# Patient Record
Sex: Female | Born: 2005 | Race: Black or African American | Hispanic: No | Marital: Single | Smoking: Never smoker
Health system: Southern US, Community
[De-identification: ages and names within clinical notes are randomized; demographics above are authoritative.]

## PROBLEM LIST (undated history)

## (undated) DIAGNOSIS — E669 Obesity, unspecified: Secondary | ICD-10-CM

## (undated) HISTORY — DX: Obesity, unspecified: E66.9

---
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
## 2024-01-04 ENCOUNTER — Other Ambulatory Visit: Payer: Self-pay | Admitting: Medical Genetics

## 2024-01-04 DIAGNOSIS — F411 Generalized anxiety disorder: Secondary | ICD-10-CM | POA: Diagnosis not present

## 2024-01-05 NOTE — Telephone Encounter (Signed)
Patient called back and said that she talked to her insurance and they said it can be covered but it just needs a prior auth done first. Patient said she talked to the pharmacy as well and they said they had faxed over form for the prior auth. Please advise

## 2024-01-06 ENCOUNTER — Telehealth: Payer: Self-pay

## 2024-01-06 ENCOUNTER — Other Ambulatory Visit (HOSPITAL_COMMUNITY): Payer: Self-pay

## 2024-01-06 NOTE — Telephone Encounter (Signed)
Trulicity needs PA

## 2024-01-06 NOTE — Telephone Encounter (Signed)
Pharmacy Patient Advocate Encounter   Received notification from Pt Calls Messages that prior authorization for Trulicity is required/requested.   Insurance verification completed.   The patient is insured through [facility] .   Per test claim: Must Try and fail metformin.   I see she has just started metformin so there is not enough data to say she has failed it.   Also, Trulicity is only indicated for type 2 diabetes, which I see she does not have, nor does she has an A1c at or above 6.5.    I am unable to submit a PA for Trulicity at this time.

## 2024-01-10 DIAGNOSIS — F603 Borderline personality disorder: Secondary | ICD-10-CM | POA: Diagnosis not present

## 2024-01-10 DIAGNOSIS — F4312 Post-traumatic stress disorder, chronic: Secondary | ICD-10-CM | POA: Diagnosis not present

## 2024-01-10 DIAGNOSIS — F3181 Bipolar II disorder: Secondary | ICD-10-CM | POA: Diagnosis not present

## 2024-01-10 DIAGNOSIS — F413 Other mixed anxiety disorders: Secondary | ICD-10-CM | POA: Diagnosis not present

## 2024-01-11 DIAGNOSIS — F411 Generalized anxiety disorder: Secondary | ICD-10-CM | POA: Diagnosis not present

## 2024-01-16 ENCOUNTER — Encounter: Payer: Self-pay | Admitting: Pediatrics

## 2024-01-16 ENCOUNTER — Ambulatory Visit (INDEPENDENT_AMBULATORY_CARE_PROVIDER_SITE_OTHER): Payer: Medicaid Other | Admitting: Pediatrics

## 2024-01-16 VITALS — Wt 228.0 lb

## 2024-01-16 DIAGNOSIS — R234 Changes in skin texture: Secondary | ICD-10-CM | POA: Diagnosis not present

## 2024-01-16 DIAGNOSIS — R238 Other skin changes: Secondary | ICD-10-CM | POA: Diagnosis not present

## 2024-01-16 NOTE — Patient Instructions (Signed)
Referred to [facility]  Wash hair with Selsun Blue shampoo 2 times a week Follow up as needed  At [facility] we value your feedback. You may receive a survey about your visit today. Please share your experience as we strive to create trusting relationships with our patients to provide genuine, compassionate, quality care.

## 2024-01-16 NOTE — Progress Notes (Signed)
Subjective:     Jenna Santos is a 18 y.o. female who presents for evaluation of scalp irritation. Starting approximately 3 months ago, she has started having periods where her scalp is enflamed. The skin turns angry red and burns. The scalp with then get dry and flaky. She has also had places on the scalp that bleed during a flare. Patient denies: abdominal pain, arthralgia, congestion, cough, crankiness, decrease in appetite, decrease in energy level, fever, headache, irritability, myalgia, nausea, sore throat, and vomiting. Patient has not had contacts with similar rash. Patient has not had new exposures (soaps, lotions, laundry detergents, foods, medications, plants, insects or animals).  The following portions of the patient's history were reviewed and updated as appropriate: allergies, current medications, past family history, past medical history, past social history, past surgical history, and problem list.  Review of Systems Pertinent items are noted in HPI.    Objective:    Wt 228 lb (103.4 kg)   BMI 41.02 kg/m  General:  alert, cooperative, appears stated age, and no distress  Skin:  Scalp is Gahan with flakes in her braids. 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   15 minutes spent in direct face to face time discussing HPI, exam, treatment, and follow up

## 2024-01-17 ENCOUNTER — Other Ambulatory Visit (HOSPITAL_COMMUNITY): Payer: Medicaid Other

## 2024-01-18 ENCOUNTER — Encounter: Payer: Self-pay | Admitting: "Endocrinology

## 2024-01-18 ENCOUNTER — Telehealth (INDEPENDENT_AMBULATORY_CARE_PROVIDER_SITE_OTHER): Payer: Medicaid Other | Admitting: "Endocrinology

## 2024-01-18 DIAGNOSIS — R7303 Prediabetes: Secondary | ICD-10-CM | POA: Diagnosis not present

## 2024-01-18 DIAGNOSIS — E88819 Insulin resistance, unspecified: Secondary | ICD-10-CM

## 2024-01-18 DIAGNOSIS — F411 Generalized anxiety disorder: Secondary | ICD-10-CM | POA: Diagnosis not present

## 2024-01-18 DIAGNOSIS — E282 Polycystic ovarian syndrome: Secondary | ICD-10-CM | POA: Diagnosis not present

## 2024-01-18 DIAGNOSIS — R7989 Other specified abnormal findings of blood chemistry: Secondary | ICD-10-CM | POA: Diagnosis not present

## 2024-01-18 MED ORDER — DEXAMETHASONE 1 MG PO TABS
1.0000 mg | ORAL_TABLET | Freq: Once | ORAL | 0 refills | Status: AC
Start: 2024-01-18 — End: 2024-01-18

## 2024-01-18 MED ORDER — METFORMIN HCL ER 500 MG PO TB24: 1000.0000 mg | ORAL_TABLET | Freq: Two times a day (BID) | ORAL | 2 refills | Status: DC

## 2024-01-18 NOTE — Patient Instructions (Addendum)
We will start you on an extended release version of metformin, which should help with the upset stomach. Start with one 500 mg capsule taken every evening with dinner. Stay on this for 3-5 days, then increase to 1 tablet with breakfast and one with dinner. If you do not have any upset stomach, gradually increase to the goal dose of 2 capsules with breakfast and 2 capsules with dinner. If you do have upset stomach, decrease it back to the previous dose that you tolerated.  Week 1: one tablet after dinner Week 2: one tablet after breakfast and one after dinner  Week 3: one tablet after breakfast and two after dinner  Week 4 and onwards: two tablets after breakfast and two after dinner

## 2024-01-18 NOTE — Progress Notes (Signed)
 The patient reports they are currently: Jenna Santos. I spent 8-9 minutes on the video with the patient on the date of service. I spent an additional 5 minutes on pre- and post-visit activities on the date of service.   The patient was physically located in [place]  or a state in which I am permitted to provide care. The patient and/or parent/guardian understood that s/he may incur co-pays and cost sharing, and agreed to the telemedicine visit. The visit was reasonable and appropriate under the circumstances given the patient's presentation at the time.  The patient and/or parent/guardian has been advised of the potential risks and limitations of this mode of treatment (including, but not limited to, the absence of in-person examination) and has agreed to be treated using telemedicine. The patient's/patient's family's questions regarding telemedicine have been answered.   The patient and/or parent/guardian has also been advised to contact their provider's office for worsening conditions, and seek emergency medical treatment and/or call 911 if the patient deems either necessary.     Outpatient Endocrinology Note Jenna Birmingham, MD    Bernadetta Roell [facility] [birth year] [id number]  Referring Provider: Belenda Macario HERO, NP Primary Care Provider: Belenda Macario HERO, NP Reason for consultation: Subjective   Assessment & Plan  Diagnoses and all orders for this visit:  Pre-diabetes  Insulin  resistance  Morbid obesity (HCC)  Elevated morning serum cortisol level -     Cortisol -     Dexamethasone , blood  PCOS (polycystic ovarian syndrome) -     Testosterone   Other orders -     metFORMIN  (GLUCOPHAGE -XR) 500 MG 24 hr tablet; Take 2 tablets (1,000 mg total) by mouth 2 (two) times daily with a meal. -     dexamethasone  (DECADRON ) 1 MG tablet; Take 1 tablet (1 mg total) by mouth once for 1 dose. Take at 11 pm followed by blood work next morning at 8 am. Timings are specific.    Return in about 3  months (around 04/16/2024) for visit and 8 am labs now .  PCOS with morbid obesity, complicated by prediabetes, insulin  resistance, elevated serum cortisol Never been on metformin  in the past No history of MEN syndrome/medullary thyroid  cancer/pancreatitis or pancreatic cancer in self or family Pt interested in weight loss Discussed lifestyle changes, medical management as well as bariatric surgery Maintain healthy lifestyle including 1200 Cal/day, 30 min of activity/day, avoiding refined/processed/outside food 20 minutes physical activity per day, in continuum or interruptedly through the day  Goals: less than 60 grams of carbohydrate/meal, 1200-1500 Cal/day, 10,0000 steps a day and weight loss of 0.5-1 lb/ wk  Sleep 7-9 hours/day, adapt good sleep hygiene Adapt de-stressing and relaxation techniques to prevent stress induced weight gain Avoid/switch medications that lead to weight gain by discussing with the prescribing physician   Not able to tolerate regulars metformin  50 mg every day due to nausea and feeling drained Recommend Metformin  XR 500 mg asa follows:    Week 1: one tablet after dinner Week 2: one tablet after breakfast and one after dinner  Week 3: one tablet after breakfast and two after dinner  Week 4 and onwards: two tablets after breakfast and two after dinner  Trulicity  not covered unless fails metformin   Elevated serum cortisol level in the morning Ordered 1 mg dexamethasone  suppression test No history of adrenal adenoma  PCOS based off of history of irregular menstrual cycle, currently on IUD managed by OB/GYN Order baseline androgen level Patient has mild hirsutism  I have reviewed current medications, nurse's  notes, allergies, vital signs, past medical and surgical history, family medical history, and social history for this encounter. Counseled patient on symptoms, examination findings, lab findings, imaging results, treatment decisions and monitoring and  prognosis. The patient understood the recommendations and agrees with the treatment plan. All questions regarding treatment plan were fully answered.  Return in about 3 months (around 04/16/2024) for visit and 8 am labs now .   Jenna Birmingham, MD  01/18/24   History of Present Illness HPI  Jenna Santos is a 18 y.o. year old female who presents for evaluation of prediabetes.  Saw an endocrinologist in past due to high A1C Limits sugars, always walking, 3-5 mi/day Never been on steroids  Started weight gain during middle school slowly over time and then again during Covid time due to personal stress   Tried metformin  500 gm every day, stopped due to nausea and feeling drained   Patient questions if she has PCOS.  She meets 2 out of 3 NIH Criteria for PCOS: Irregular menstrual cycles: history of both Oligomenorrhea (infrequent periods) or anovulation (absence of periods). Has IUD now. Clinical or biochemical hyperandrogenism: acne and hirsutism (excessive hair growth) Polycystic ovaries: no ultrasound imaging  Physical Exam  There were no vitals taken for this visit.   Constitutional: well developed, well nourished Head: normocephalic, atraumatic Eyes: sclera anicteric, no redness Neck: supple Lungs: normal respiratory effort Neurology: alert and oriented Skin: dry, no appreciable rashes Musculoskeletal: no appreciable defects Psychiatric: normal mood and affect   Current Medications Patient's Medications  New Prescriptions   DEXAMETHASONE  (DECADRON ) 1 MG TABLET    Take 1 tablet (1 mg total) by mouth once for 1 dose. Take at 11 pm followed by blood work next morning at 8 am. Timings are specific.   METFORMIN  (GLUCOPHAGE -XR) 500 MG 24 HR TABLET    Take 2 tablets (1,000 mg total) by mouth 2 (two) times daily with a meal.  Previous Medications   ADAPALENE  (DIFFERIN ) 0.1 % CREAM    Apply topically at bedtime.   CETIRIZINE  (ZYRTEC ) 10 MG TABLET    Take 1 tablet (10 mg  total) by mouth daily.   DULAGLUTIDE  (TRULICITY ) 0.75 MG/0.5ML SOAJ    Inject 0.75 mg into the skin once a week.   EPINEPHRINE  (EPIPEN  2-PAK) 0.3 MG/0.3 ML IJ SOAJ INJECTION    Inject 0.3 mg into the muscle as needed for anaphylaxis.   FLUTICASONE  (FLONASE ) 50 MCG/ACT NASAL SPRAY    Place 2 sprays into both nostrils daily.   LEVONORGESTREL  (MIRENA ) 20 MCG/DAY IUD    1 each by Intrauterine route once.   METHYLPHENIDATE  (RITALIN ) 5 MG TABLET    Take 1 tablet (5 mg total) by mouth daily after lunch.   METHYLPHENIDATE  36 MG PO CR TABLET    Take 1 tablet (36 mg total) by mouth daily with breakfast.   TOPIRAMATE  (TOPAMAX ) 25 MG TABLET    TAKE 1 TABLET BY MOUTH ONCE DAILY AT NIGHT  Modified Medications   No medications on file  Discontinued Medications   METFORMIN  (GLUCOPHAGE ) 500 MG TABLET    Take 2 tablets (1,000 mg total) by mouth 2 (two) times daily with a meal.    Allergies Allergies  Allergen Reactions   Banana Anaphylaxis    Past Medical History Past Medical History:  Diagnosis Date   Acanthosis nigricans 10/17/2018   Amenorrhea 03/31/2020   Attention deficit hyperactivity disorder (ADHD) 07/22/2022   Dysmenorrhea 03/15/2022   Elevated hemoglobin A1c 11/07/2018   Obesity  Tinnitus of both ears 09/12/2023    Past Surgical History Past Surgical History:  Procedure Laterality Date   WISDOM TOOTH EXTRACTION Bilateral 2024    Family History family history includes Arthritis in her maternal grandmother; Cancer in her maternal grandfather; Diabetes in her maternal grandfather; Hyperlipidemia in her maternal grandmother; Hypertension in her maternal grandmother; Stroke in her maternal grandmother.  Social History Social History   Socioeconomic History   Marital status: Single    Spouse name: Not on file   Number of children: Not on file   Years of education: Not on file   Highest education level: Not on file  Occupational History   Not on file  Tobacco Use   Smoking  status: Never    Passive exposure: Never   Smokeless tobacco: Never  Vaping Use   Vaping status: Never Used  Substance and Sexual Activity   Alcohol use: No   Drug use: No   Sexual activity: Yes    Birth control/protection: Condom, I.U.D.  Other Topics Concern   Not on file  Social History Narrative   Lives at home with mom   Father not involved   12th grade at [employer]   Left handed          Social Drivers of Health   Financial Resource Strain: Not on file  Food Insecurity: Not on file  Transportation Needs: Not on file  Physical Activity: Not on file  Stress: Not on file  Social Connections: Not on file  Intimate Partner Violence: Not on file    Lab Results  Component Value Date   CHOL 150 12/21/2023   Lab Results  Component Value Date   HDL 63 12/21/2023   Lab Results  Component Value Date   LDLCALC 76 12/21/2023   Lab Results  Component Value Date   TRIG 37 12/21/2023   Lab Results  Component Value Date   CHOLHDL 2.4 12/21/2023   Lab Results  Component Value Date   CREATININE 0.85 12/21/2023   No results found for: GFR    Component Value Date/Time   NA 140 12/21/2023 0920   K 4.9 12/21/2023 0920   CL 105 12/21/2023 0920   CO2 27 12/21/2023 0920   GLUCOSE 88 12/21/2023 0920   BUN 21 (H) 12/21/2023 0920   CREATININE 0.85 12/21/2023 0920   CALCIUM 9.4 12/21/2023 0920   PROT 6.8 12/21/2023 0920   AST 12 12/21/2023 0920   ALT 9 12/21/2023 0920   BILITOT 0.3 12/21/2023 0920   GFRNONAA NOT CALCULATED 02/09/2023 1550      Latest Ref Rng & Units 12/21/2023    9:20 AM 10/17/2023   11:41 AM 02/18/2023   11:51 AM  BMP  Glucose 65 - 99 mg/dL 88  94  88   BUN 7 - 20 mg/dL 21  15  11    Creatinine 0.50 - 0.96 mg/dL 9.14  9.13  9.09   BUN/Creat Ratio 6 - 22 (calc) 25  SEE NOTE:  SEE NOTE:   Sodium 135 - 146 mmol/L 140  138  139   Potassium 3.8 - 5.1 mmol/L 4.9  4.5  4.1   Chloride 98 - 110 mmol/L 105  104  105   CO2 20 - 32 mmol/L 27   26  22    Calcium 8.9 - 10.4 mg/dL 9.4  9.8  9.5        Component Value Date/Time   WBC 6.9 10/17/2023 1141   RBC 5.50 (H)  10/17/2023 1141   HGB 12.6 10/17/2023 1141   HCT 41.9 10/17/2023 1141   PLT 320 10/17/2023 1141   MCV 76.2 (L) 10/17/2023 1141   MCH 22.9 (L) 10/17/2023 1141   MCHC 30.1 (L) 10/17/2023 1141   RDW 14.0 10/17/2023 1141   LYMPHSABS 2,449 02/18/2023 1151   MONOABS 0.6 02/09/2023 1550   EOSABS 41 10/17/2023 1141   BASOSABS 48 10/17/2023 1141   Lab Results  Component Value Date   TSH 2.08 12/21/2023   TSH 1.54 10/17/2023   TSH 1.23 10/17/2018   FREET4 1.2 12/21/2023   FREET4 1.1 10/17/2023   FREET4 1.1 10/17/2018         Parts of this note may have been dictated using voice recognition software. There may be variances in spelling and vocabulary which are unintentional. Not all errors are proofread. Please notify the dino if any discrepancies are noted or if the meaning of any statement is not clear.

## 2024-01-20 ENCOUNTER — Telehealth (INDEPENDENT_AMBULATORY_CARE_PROVIDER_SITE_OTHER): Payer: Medicaid Other | Admitting: Family

## 2024-01-20 DIAGNOSIS — F900 Attention-deficit hyperactivity disorder, predominantly inattentive type: Secondary | ICD-10-CM

## 2024-01-20 NOTE — Progress Notes (Signed)
 THIS RECORD MAY CONTAIN CONFIDENTIAL INFORMATION THAT SHOULD NOT BE RELEASED WITHOUT REVIEW OF THE SERVICE PROVIDER.  Virtual Follow-Up Visit via Video Note  I connected with Jenna Santos   on 01/20/24 at 10:00 AM EST by a video enabled telemedicine application and verified that I am speaking with the correct person using two identifiers.   Patient/parent location: home Provider location: remote Jenna Santos   I discussed the limitations of evaluation and management by telemedicine and the availability of in person appointments.  I discussed that the purpose of this telehealth visit is to provide medical care while limiting exposure to the novel coronavirus.  The patient expressed understanding and agreed to proceed.   Jenna Santos is a 18 y.o. female referred by Jenna Santos HERO, NP here today for follow-up of ADHD, predominately inattentive.   History was provided by the patient.  Supervising Physician: Dr. Kreg Helena   Plan from Last Visit 02/23/2023   1. Prediabetes 2. PCOS (polycystic ovarian syndrome) 3. Vitamin D  deficiency 4. Attention deficit hyperactivity disorder (ADHD), predominantly inattentive type   -discussed Metformin , side effects and expected benefits; pre-contemplative for medication vs lifestyle changes  -discussed concern for appetite suppression with Concerta   use and could consider change in medication; I would caution Metformin  use with current ADHD medication to avoid appetite suppression and possible GI upset with medication; she will think  on this and we will discuss more  -continue Vitamin D  supplementation    I discussed the assessment and treatment plan with the patient and/or parent/guardian.  They were provided an opportunity to ask questions and all were answered.  They agreed with the plan and demonstrated an understanding of the instructions. They were advised to call back or seek an in-person evaluation in the emergency room if the  symptoms worsen or if the condition fails to improve as anticipated.  Chief Complaint: ADHD  History of Present Illness:  -got IUD in summer, lighter periods, prolonged spotting but much better  -has been seeing Endo; started on Metformin  and had some stomach issues; considering Trulicity  if can't tolerate the metformin .  -seeing [facility] and prescribed Lamictal and now Latuda added  -she is not sure what this is treating and she and her mom discussed asking for a new referral for psychiatry; also is open to having psychiatry manage all meds -with Concerta , has been mindful of taking it because pharmacy has been out  -only has class once per week but will still like it for when she is doing school  -feels OK with just long-acting this semester but may need short-acting when schedule changes    Allergies  Allergen Reactions   Banana Anaphylaxis   Outpatient Medications Prior to Visit  Medication Sig Dispense Refill   adapalene  (DIFFERIN ) 0.1 % cream Apply topically at bedtime. 45 g 0   cetirizine  (ZYRTEC ) 10 MG tablet Take 1 tablet (10 mg total) by mouth daily. 30 tablet 2   Dulaglutide  (TRULICITY ) 0.75 MG/0.5ML SOAJ Inject 0.75 mg into the skin once a week. 2 mL 0   EPINEPHrine  (EPIPEN  2-PAK) 0.3 mg/0.3 mL IJ SOAJ injection Inject 0.3 mg into the muscle as needed for anaphylaxis. 2 each 6   fluticasone  (FLONASE ) 50 MCG/ACT nasal spray Place 2 sprays into both nostrils daily. 16 g 12   levonorgestrel  (MIRENA ) 20 MCG/DAY IUD 1 each by Intrauterine route once.     metFORMIN  (GLUCOPHAGE -XR) 500 MG 24 hr tablet Take 2 tablets (1,000 mg total) by mouth 2 (two)  times daily with a meal. 120 tablet 2   methylphenidate  (RITALIN ) 5 MG tablet Take 1 tablet (5 mg total) by mouth daily after lunch. 30 tablet 0   methylphenidate  36 MG PO CR tablet Take 1 tablet (36 mg total) by mouth daily with breakfast. 30 tablet 0   topiramate  (TOPAMAX ) 25 MG tablet TAKE 1 TABLET BY MOUTH ONCE DAILY AT NIGHT 30  tablet 5   No facility-administered medications prior to visit.     Patient Active Problem List   Diagnosis Date Noted   Scalp irritation 01/16/2024   Flaking of scalp 01/16/2024   Dizziness 09/12/2023   Headache in back of head 09/12/2023   Nausea without vomiting 09/12/2023   Pre-diabetes 10/18/2018    The following portions of the patient's history were reviewed and updated as appropriate: allergies, current medications, past family history, past medical history, past social history, past surgical history, and problem list.  Visual Observations/Objective:   General Appearance: Well nourished well developed, in no apparent distress.  Eyes: conjunctiva no swelling or erythema ENT/Mouth: No hoarseness, No cough for duration of visit.  Neck: Supple  Respiratory: Respiratory effort normal, normal rate, no retractions or distress.   Cardio: Appears well-perfused, noncyanotic Musculoskeletal: no obvious deformity Skin: visible skin without rashes, ecchymosis, erythema Neuro: Awake and oriented X 3,  Psych:  normal affect, Insight and Judgment appropriate.    Assessment/Plan: 1. Attention deficit hyperactivity disorder (ADHD), predominantly inattentive type (Primary) -continue with Concerta  36 mg; discussed concerns for appetite suppression and Metformin  GI upset; for now she would like to keep the medication as she knows how it is working for her; she has not yet started Latuda and is open to referral to [facility] for ongoing medication management, including ADHD medication; discussed that it is best for one provider to manage all medications  -safety confirmed -she will advise another pharmacy that has Concerta  in stock - Ambulatory referral to Psychiatry  I discussed the assessment and treatment plan with the patient and/or parent/guardian.  They were provided an opportunity to ask questions and all were answered.  They agreed with the plan and demonstrated an  understanding of the instructions. They were advised to call back or seek an in-person evaluation in the emergency room if the symptoms worsen or if the condition fails to improve as anticipated.   Follow-up:   as needed until psychiatry referral placed   Bari CHRISTELLA Molt, NP    CC: Jenna Santos CHRISTELLA, NP, Jenna Santos CHRISTELLA, NP

## 2024-01-21 ENCOUNTER — Encounter: Payer: Self-pay | Admitting: Family

## 2024-01-24 ENCOUNTER — Telehealth: Payer: Self-pay

## 2024-01-24 NOTE — Telephone Encounter (Signed)
Pharmacy Patient Advocate Encounter  Received notification from [facility] that Prior Authorization for Trulicity has been DENIED.  Full denial letter will be uploaded to the media tab. See denial reason below.

## 2024-01-25 ENCOUNTER — Other Ambulatory Visit (HOSPITAL_COMMUNITY): Payer: Self-pay

## 2024-01-25 DIAGNOSIS — F411 Generalized anxiety disorder: Secondary | ICD-10-CM | POA: Diagnosis not present

## 2024-01-26 MED ORDER — TIRZEPATIDE-WEIGHT MANAGEMENT 2.5 MG/0.5ML ~~LOC~~ SOLN: 2.5000 mg | SUBCUTANEOUS | 3 refills | Status: DC

## 2024-01-26 NOTE — Addendum Note (Signed)
Addended by: Lisabeth Pick on: 01/26/2024 08:52 AM   Modules accepted: Orders

## 2024-01-31 ENCOUNTER — Encounter: Payer: Self-pay | Admitting: "Endocrinology

## 2024-01-31 ENCOUNTER — Other Ambulatory Visit (HOSPITAL_COMMUNITY): Payer: Medicaid Other

## 2024-01-31 DIAGNOSIS — F411 Generalized anxiety disorder: Secondary | ICD-10-CM | POA: Diagnosis not present

## 2024-02-01 ENCOUNTER — Telehealth: Payer: Self-pay

## 2024-02-01 ENCOUNTER — Other Ambulatory Visit (HOSPITAL_COMMUNITY): Payer: Self-pay

## 2024-02-01 IMAGING — CT CT HEAD W/O CM
4 series · 15 of 47 positions shown, 17 images · non-contrast
Comparison: None Available.

CLINICAL DATA: Head trauma, GCS=15, severe headache (Ped 2-17y)



[Series 3: head without · axial · non-contrast · 0.50mm/px · z∈[-196,-76]mm · 7 of 32 slices shown, 9 images]
[im 4/32  brain]
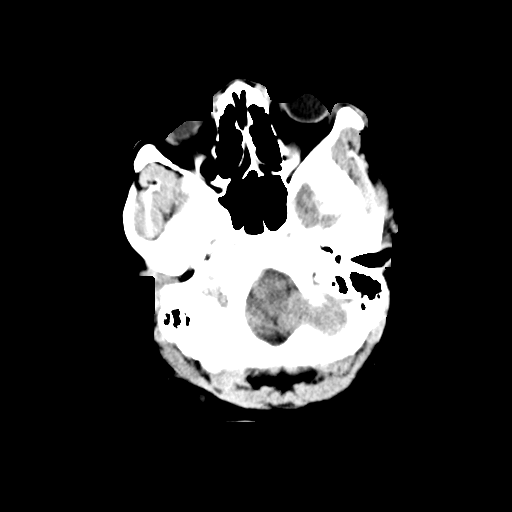
[im 4/32  bone]
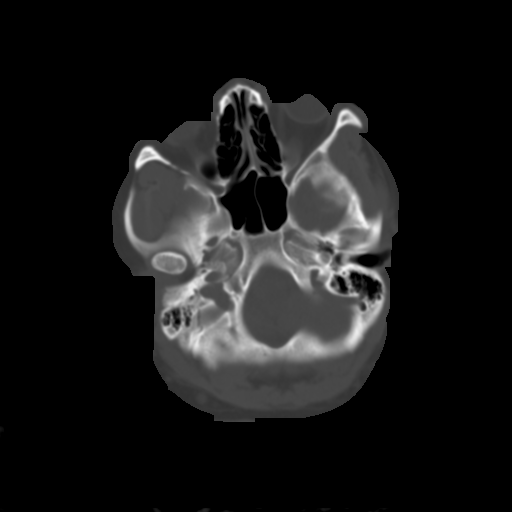
[im 8/32  brain]
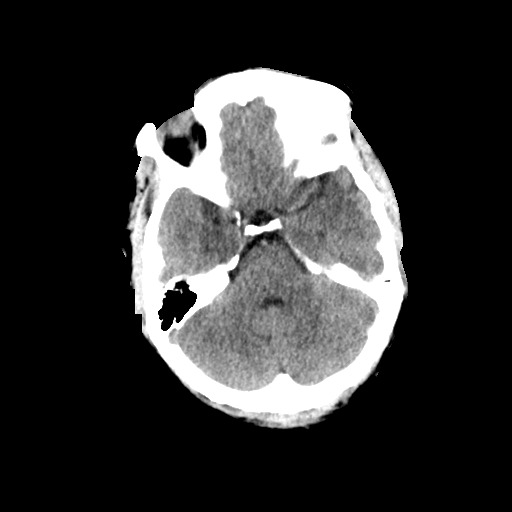
[im 12/32  brain]
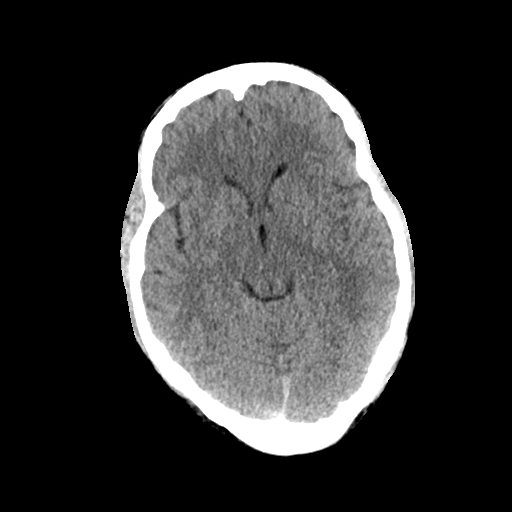
[im 16/32  brain]
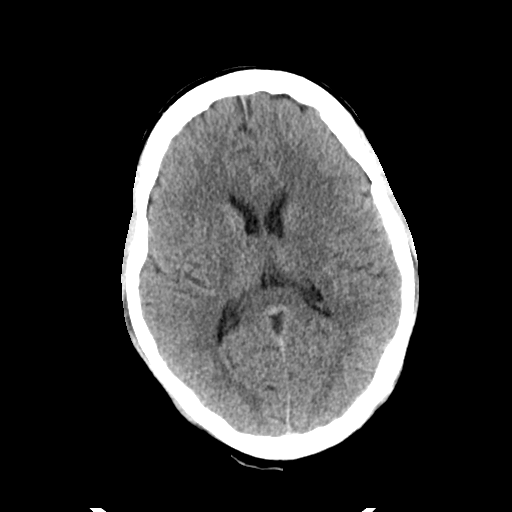
[im 20/32  brain]
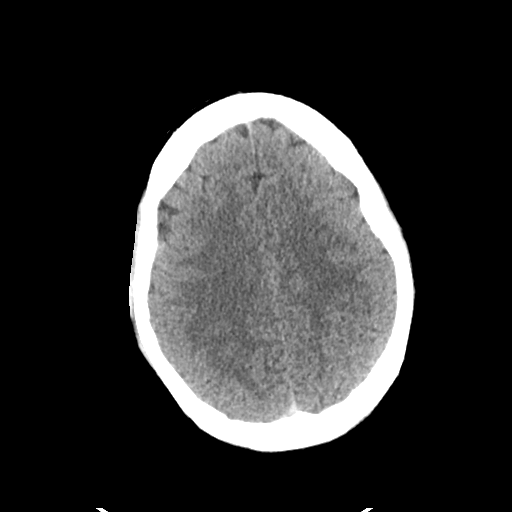
[im 20/32  bone]
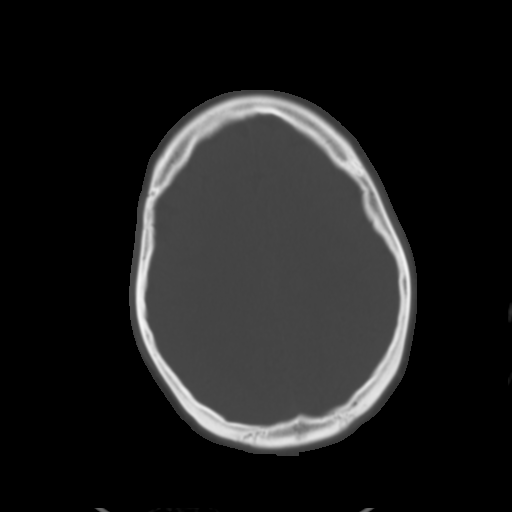
[im 24/32  brain]
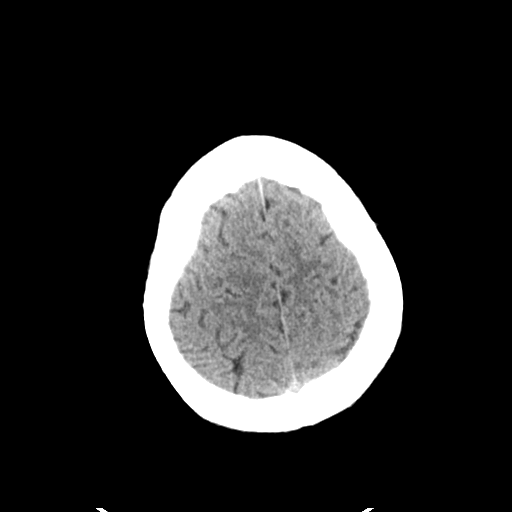
[im 28/32  brain]
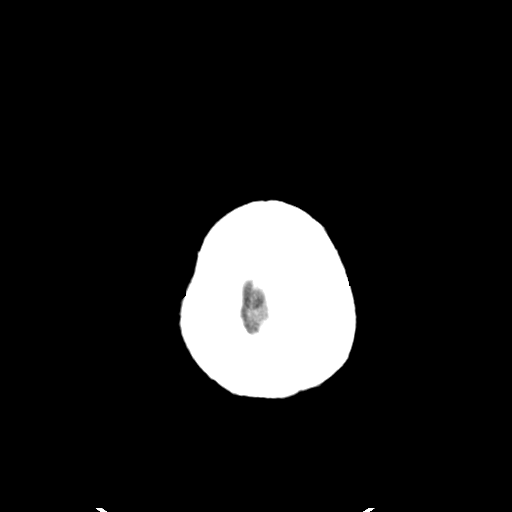

[Series 4: head bone · axial · 0.50mm/px · z∈[-197,-181]mm · 2 of 80 slices shown]
[im 8/80  bone]
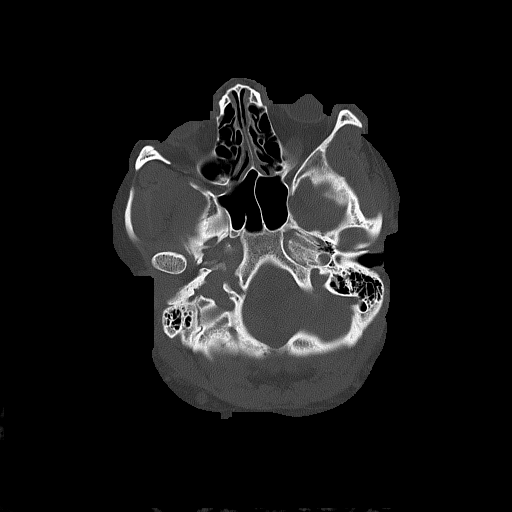
[im 16/80  bone]
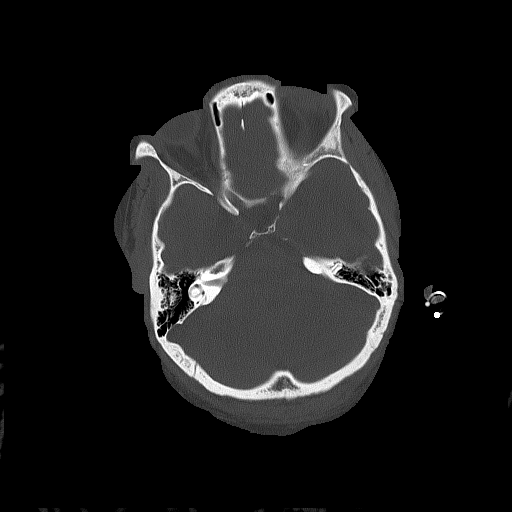

[Series 5: head without cor · coronal · non-contrast · 0.31mm/px · 3 of 74 slices shown]
[im 25/74  brain]
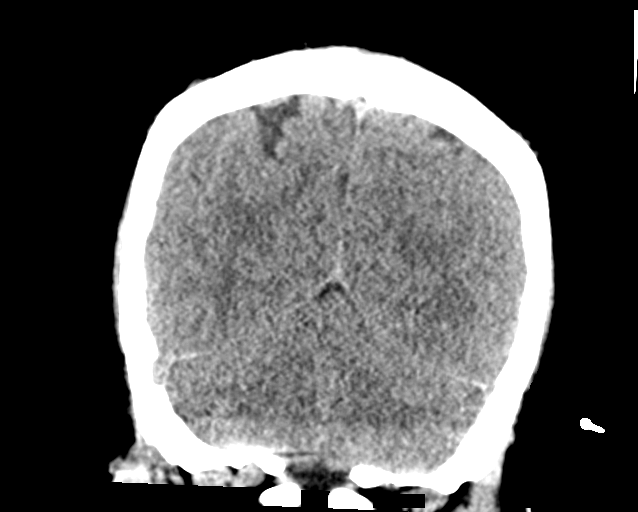
[im 33/74  brain]
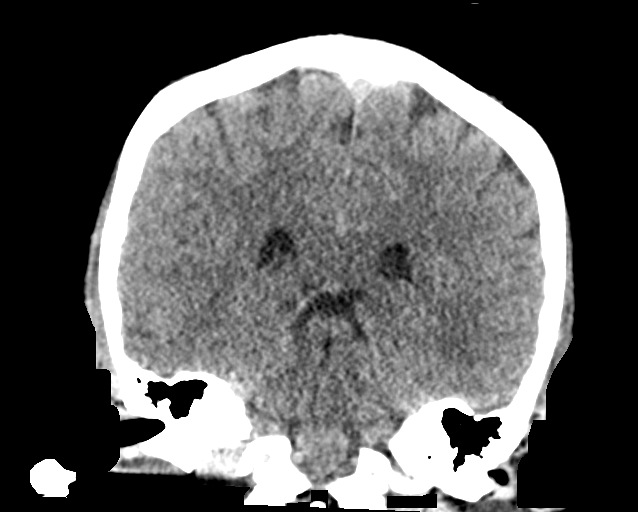
[im 41/74  brain]
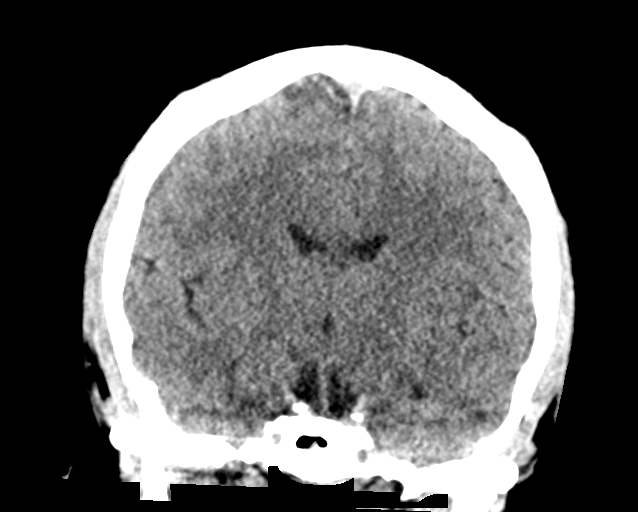

[Series 6: head without sag · sagittal · non-contrast · 0.31mm/px · 3 of 67 slices shown]
[im 23/67  brain]
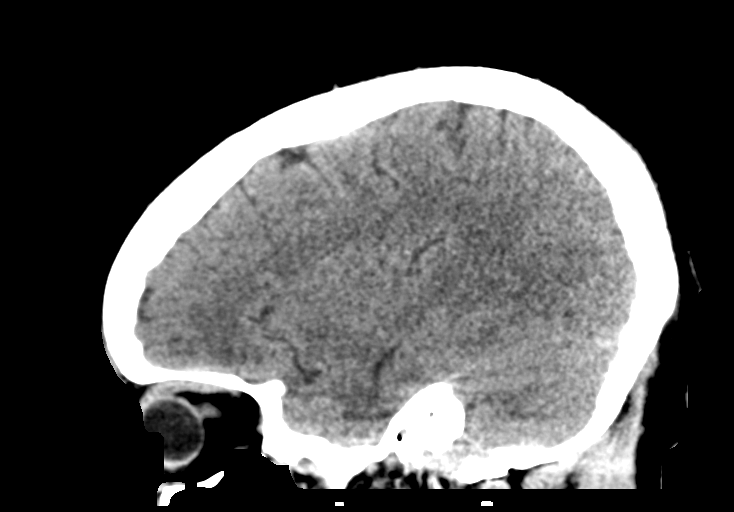
[im 34/67  brain]
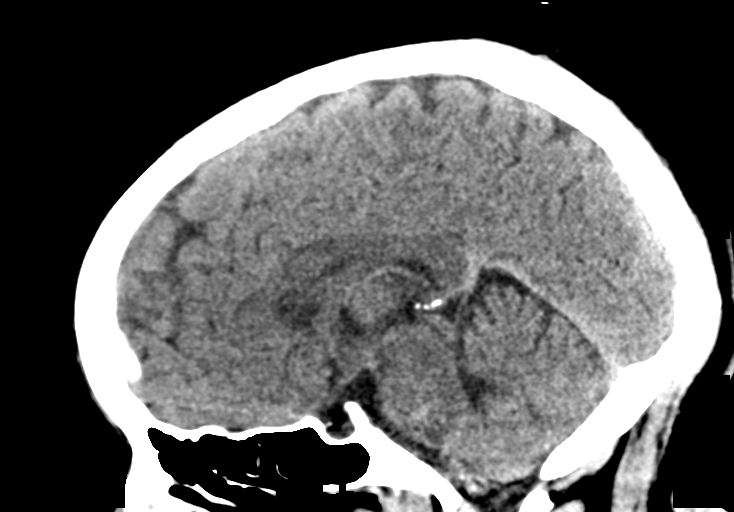
[im 45/67  brain]
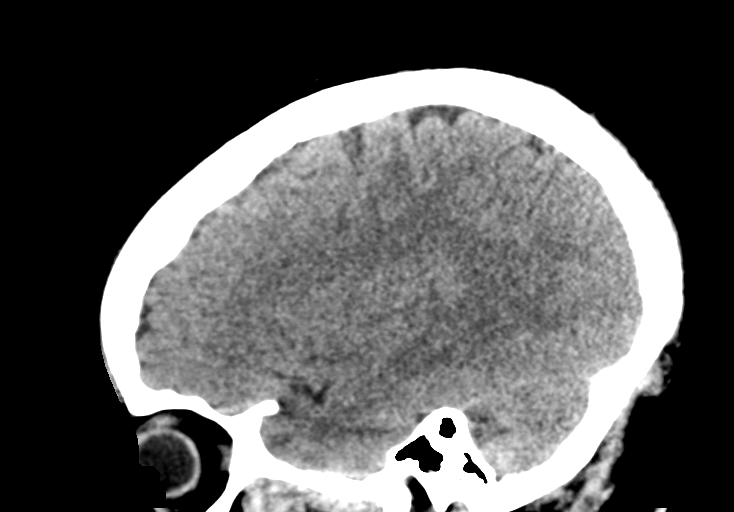

[15 of 47 positions shown; findings below may reference images not displayed]

FINDINGS: Brain: No evidence of acute infarction, hemorrhage, hydrocephalus,
extra-axial collection or mass lesion/mass effect. Partially empty
sella.

Vascular: No hyperdense vessel identified.

Skull: No acute fracture.

Sinuses/Orbits: Clear sinuses.  No acute orbital findings.

Other: No mastoid effusions.
IMPRESSION: 1. No evidence of acute intracranial abnormality.
2. Partially empty sella, which is often a normal anatomic variant
but can be associated with idiopathic intracranial hypertension.

## 2024-02-01 NOTE — Telephone Encounter (Signed)
 Pharmacy Patient Advocate Encounter   Received notification from Pt Calls Messages that prior authorization for [facility] is required/requested.   Insurance verification completed.   The patient is insured through [facility] .   Per test claim: PA required; PA submitted to above mentioned insurance via CoverMyMeds Key/confirmation #/EOC [facility] Status is pending

## 2024-02-01 NOTE — Telephone Encounter (Signed)
 Can we submit a new preauthorization for [facility] with the below information (patient tried metformin but cannot tolerate it)?Marland Kitchen

## 2024-02-01 NOTE — Telephone Encounter (Signed)
 Reginal Lutes is what she would like to do PA on

## 2024-02-03 ENCOUNTER — Telehealth (INDEPENDENT_AMBULATORY_CARE_PROVIDER_SITE_OTHER): Payer: Medicaid Other | Admitting: Neurology

## 2024-02-03 ENCOUNTER — Encounter: Payer: Self-pay | Admitting: Neurology

## 2024-02-03 VITALS — Ht 62.0 in | Wt 226.0 lb

## 2024-02-03 DIAGNOSIS — R42 Dizziness and giddiness: Secondary | ICD-10-CM | POA: Diagnosis not present

## 2024-02-03 DIAGNOSIS — G43009 Migraine without aura, not intractable, without status migrainosus: Secondary | ICD-10-CM

## 2024-02-03 NOTE — Progress Notes (Signed)
 Virtual Visit via Video Note The purpose of this virtual visit is to provide medical care while limiting exposure to the novel coronavirus.    Consent was obtained for video visit:  Yes.   Answered questions that patient had about telehealth interaction:  Yes.     Pt location: Home Physician Location: office Name of referring provider:  Estelle June, NP I connected with Jenna Santos at patients initiation/request on 02/03/2024 at  9:00 AM EST by video enabled telemedicine application and verified that I am speaking with the correct person using two identifiers. Pt MRN:  [id number] Pt DOB:  23-Nov-2006 Video Participants:  Jenna Santos   History of Present Illness:  The patient had a virtual video visit on 02/03/2024. She was last seen in the neurology clinic 3 months ago for dizziness and headaches. Description of dizziness suggestive of near syncope where she describes feeling like she would pass out, seeing stars, nausea, tinnitus. Her BP runs low, on initial visit she had a 10-point drop in SBP from supine to standing (BP standing 86/66). She was also reporting new onset throbbing headaches, she was seen by ENT with normal exam, raising concern for vestibular migraines, although she denies any spinning sensation to the dizziness. She had a brain MRI with and without contrast which I personally reviewed, it was normal. She was started on migraine prophylaxis with low dose Topiramate on last visit, but she started feeling weird and stopped it. She is not sure it helped. She does note the headaches have calmed down a lot, she has one every 2 weeks or so, no catamenial component. She notes that despite reduction in headaches, the constant dizziness is still there despite staying hydrated. She feels lightheaded and nauseated, no loss of consciousness, no falls. She feels her heart beating fast and would be lying down and feel her "breath just taken out of me." She will be seeing  Cardiology next month. She denies any unresponsiveness but sometimes dazes off, she can hear her mother faintly calling her and it takes a bit to snap out of it. These occur separate from the dizziness, around 1-2 times a week. She is getting a lot more sleep, the Latuda makes her sleep, usually with 7 hours of sleep.    History on Initial Assessment 10/27/2023: This is a pleasant 18 year old left-handed woman with a history of ADHD presenting for evaluation of dizziness and headaches. She reports symptoms started gradually around 2.5 months ago, she started getting a little dizzy seeing black stars, feeling like she would pass out. This started to get worse and occur on a daily basis with associated nausea, high-pitched ringing in both ears for 5 minutes. They can occur sitting or standing. If she is standing, she would sit and this helps some. There is no associated confusion, speech changes, she just feels drowsy. No focal numbness/tingling/weakness. A month ago, she started having diffuse headaches also lasting for several minutes. Pain would be throbbing all over, no photo/phonophobia. She does not take any pain medication. One time she had to leave work early due to the symptoms, she was nauseated and felt discombobulated. Her friend denies any staring/unresponsive episodes. She keeps hydrated, drinking at least 36-40 oz daily. She usually gets 7 hours of sleep. She denies any alcohol use. No neck/back pain. She had an eye exam in July which was normal. She has occasional palpitations, no chest pain or shortness of breath. She had seen ENT for the symptoms  and had normal hearing and vestibular testing, it was noted that some features are suspicious for vestibular migraine. She denies any spinning sensation. She is in college majoring in Speech language pathology.   Her head CT without contrast done 04/2022 after a head injury did not show any acute changes, there was note of a partially empty sella.       Current Outpatient Medications on File Prior to Visit  Medication Sig Dispense Refill   cetirizine (ZYRTEC) 10 MG tablet Take 1 tablet (10 mg total) by mouth daily. 30 tablet 2   EPINEPHrine (EPIPEN 2-PAK) 0.3 mg/0.3 mL IJ SOAJ injection Inject 0.3 mg into the muscle as needed for anaphylaxis. 2 each 6   fluticasone (FLONASE) 50 MCG/ACT nasal spray Place 2 sprays into both nostrils daily. 16 g 12   lamoTRIgine (LAMICTAL) 150 MG tablet Take 150 mg by mouth at bedtime.     levonorgestrel (MIRENA) 20 MCG/DAY IUD 1 each by Intrauterine route once.     lurasidone (LATUDA) 40 MG TABS tablet Take 40 mg by mouth daily.     methylphenidate 36 MG PO CR tablet Take 1 tablet (36 mg total) by mouth daily with breakfast. 30 tablet 0   Dulaglutide (TRULICITY) 0.75 MG/0.5ML SOAJ Inject 0.75 mg into the skin once a week. (Patient not taking: Reported on 02/03/2024) 2 mL 0   methylphenidate (RITALIN) 5 MG tablet Take 1 tablet (5 mg total) by mouth daily after lunch. 30 tablet 0   No current facility-administered medications on file prior to visit.     Observations/Objective:   Vitals:   02/03/24 0812  Weight: 226 lb (102.5 kg)  Height: 5\' 2"  (1.575 m)   GEN:  The patient appears stated age and is in NAD. Neurological examination: Patient is awake, alert. No aphasia or dysarthria. Intact fluency and comprehension. Cranial nerves: Extraocular movements intact. No facial asymmetry. Motor: moves all extremities symmetrically, at least anti-gravity x 4.   Assessment and Plan:   This is a pleasant 18 yo LH woman with a history of ADHD who presented for dizziness and headaches. Description of dizziness suggestive of near syncope where she describes feeling like she would pass out, seeing stars, nausea, tinnitus. BP runs low, she had a 10-point drop from supine to standing on initial visit. MRI brain normal. ENT raised concern for vestibular migraines, however she reports improvement in headaches (without any  medication) but continued lightheadedness despite hydration. She also reports palpitations. Proceed with Cardiology evaluation. We have agreed to hold off on migraine prophylactic medication for now. Follow-up in 3 months, call for any changes.    Follow Up Instructions:   -I discussed the assessment and treatment plan with the patient. The patient was provided an opportunity to ask questions and all were answered. The patient agreed with the plan and demonstrated an understanding of the instructions.   The patient was advised to call back or seek an in-person evaluation if the symptoms worsen or if the condition fails to improve as anticipated.     Van Clines, MD

## 2024-02-03 NOTE — Patient Instructions (Signed)
 Good to see you!  Proceed with Cardiology evaluation  2. Continue with increased hydration, liberalize salt intake  3. Follow-up in 3 months, call for any changes.

## 2024-02-03 NOTE — Telephone Encounter (Signed)
 Pharmacy Patient Advocate Encounter  Received notification from [facility] that Prior Authorization for Reginal Lutes has been APPROVED through July 30, 2024   PA #/Case ID/Reference #: [id number]

## 2024-02-06 ENCOUNTER — Other Ambulatory Visit: Payer: Self-pay

## 2024-02-06 MED ORDER — WEGOVY 2.4 MG/0.75ML ~~LOC~~ SOAJ
2.4000 mg | SUBCUTANEOUS | 1 refills | Status: DC
Start: 2024-02-06 — End: 2024-02-08

## 2024-02-07 DIAGNOSIS — F603 Borderline personality disorder: Secondary | ICD-10-CM | POA: Diagnosis not present

## 2024-02-07 DIAGNOSIS — F3181 Bipolar II disorder: Secondary | ICD-10-CM | POA: Diagnosis not present

## 2024-02-07 DIAGNOSIS — F413 Other mixed anxiety disorders: Secondary | ICD-10-CM | POA: Diagnosis not present

## 2024-02-07 DIAGNOSIS — F4312 Post-traumatic stress disorder, chronic: Secondary | ICD-10-CM | POA: Diagnosis not present

## 2024-02-08 ENCOUNTER — Ambulatory Visit: Payer: Self-pay | Admitting: Pediatrics

## 2024-02-08 ENCOUNTER — Encounter (HOSPITAL_BASED_OUTPATIENT_CLINIC_OR_DEPARTMENT_OTHER): Payer: Self-pay | Admitting: Emergency Medicine

## 2024-02-08 ENCOUNTER — Other Ambulatory Visit: Payer: Self-pay

## 2024-02-08 ENCOUNTER — Encounter (HOSPITAL_COMMUNITY): Payer: Self-pay

## 2024-02-08 ENCOUNTER — Emergency Department (HOSPITAL_COMMUNITY)
Admission: EM | Admit: 2024-02-08 | Discharge: 2024-02-08 | Disposition: A | Discharge status: Against Medical Advice | Payer: Medicaid Other | Attending: Emergency Medicine | Admitting: Emergency Medicine

## 2024-02-08 ENCOUNTER — Emergency Department (HOSPITAL_BASED_OUTPATIENT_CLINIC_OR_DEPARTMENT_OTHER)
Admission: EM | Admit: 2024-02-08 | Discharge: 2024-02-08 | Disposition: A | Discharge status: Home / Self Care | Payer: Medicaid Other | Source: Home / Self Care | Attending: Emergency Medicine | Admitting: Emergency Medicine

## 2024-02-08 DIAGNOSIS — F411 Generalized anxiety disorder: Secondary | ICD-10-CM | POA: Diagnosis not present

## 2024-02-08 DIAGNOSIS — R109 Unspecified abdominal pain: Secondary | ICD-10-CM | POA: Insufficient documentation

## 2024-02-08 DIAGNOSIS — Z5321 Procedure and treatment not carried out due to patient leaving prior to being seen by health care provider: Secondary | ICD-10-CM | POA: Diagnosis not present

## 2024-02-08 DIAGNOSIS — R1110 Vomiting, unspecified: Secondary | ICD-10-CM | POA: Diagnosis present

## 2024-02-08 DIAGNOSIS — N946 Dysmenorrhea, unspecified: Secondary | ICD-10-CM | POA: Insufficient documentation

## 2024-02-08 DIAGNOSIS — R112 Nausea with vomiting, unspecified: Secondary | ICD-10-CM | POA: Insufficient documentation

## 2024-02-08 LAB — WET PREP, GENITAL
Clue Cells Wet Prep HPF POC: NONE SEEN
Sperm: NONE SEEN
Trich, Wet Prep: NONE SEEN
WBC, Wet Prep HPF POC: <10 (ref <10)
Yeast Wet Prep HPF POC: NONE SEEN

## 2024-02-08 LAB — COMPREHENSIVE METABOLIC PANEL
ALT: 13 U/L (ref 0–44)
ALT: 9 U/L (ref 0–44)
AST: 18 U/L (ref 15–41)
AST: 24 U/L (ref 15–41)
Albumin: 4.1 g/dL (ref 3.5–5.0)
Albumin: 4.9 g/dL (ref 3.5–5.0)
Alkaline Phosphatase: 72 U/L (ref 38–126)
Alkaline Phosphatase: 78 U/L (ref 38–126)
Anion gap: 11 (ref 5–15)
Anion gap: 13 (ref 5–15)
BUN: 15 mg/dL (ref 6–20)
BUN: 20 mg/dL (ref 6–20)
CO2: 24 mmol/L (ref 22–32)
CO2: 23 mmol/L (ref 22–32)
Calcium: 9.8 mg/dL (ref 8.9–10.3)
Calcium: 10 mg/dL (ref 8.9–10.3)
Chloride: 105 mmol/L (ref 98–111)
Chloride: 104 mmol/L (ref 98–111)
Creatinine, Ser: 1.07 mg/dL — ABNORMAL HIGH (ref 0.44–1.00)
Creatinine, Ser: 0.96 mg/dL (ref 0.44–1.00)
GFR, Estimated: >60 mL/min (ref >60)
GFR, Estimated: >60 mL/min (ref >60)
Glucose, Bld: 84 mg/dL (ref 70–99)
Glucose, Bld: 68 mg/dL — ABNORMAL LOW (ref 70–99)
Potassium: 4.3 mmol/L (ref 3.5–5.1)
Potassium: 4.9 mmol/L (ref 3.5–5.1)
Sodium: 140 mmol/L (ref 135–145)
Sodium: 140 mmol/L (ref 135–145)
Total Bilirubin: 0.9 mg/dL (ref 0.0–1.2)
Total Bilirubin: 0.6 mg/dL (ref 0.0–1.2)
Total Protein: 7.7 g/dL (ref 6.5–8.1)
Total Protein: 7.9 g/dL (ref 6.5–8.1)

## 2024-02-08 LAB — CBC
HCT: 43.6 % (ref 36.0–46.0)
Hemoglobin: 13.5 g/dL (ref 12.0–15.0)
MCH: 23 pg — ABNORMAL LOW (ref 26.0–34.0)
MCHC: 31 g/dL (ref 30.0–36.0)
MCV: 74.1 fL — ABNORMAL LOW (ref 80.0–100.0)
Platelets: 307 10*3/uL (ref 150–400)
RBC: 5.88 MIL/uL — ABNORMAL HIGH (ref 3.87–5.11)
RDW: 14.8 % (ref 11.5–15.5)
WBC: 8.3 10*3/uL (ref 4.0–10.5)
nRBC: 0 % (ref 0.0–0.2)

## 2024-02-08 LAB — CBC WITH DIFFERENTIAL/PLATELET
Abs Immature Granulocytes: 0.02 10*3/uL (ref 0.00–0.07)
Basophils Absolute: 0 10*3/uL (ref 0.0–0.1)
Basophils Relative: 0 %
Eosinophils Absolute: 0 10*3/uL (ref 0.0–0.5)
Eosinophils Relative: 0 %
HCT: 44.7 % (ref 36.0–46.0)
Hemoglobin: 13.9 g/dL (ref 12.0–15.0)
Immature Granulocytes: 0 %
Lymphocytes Relative: 23 %
Lymphs Abs: 1.9 10*3/uL (ref 0.7–4.0)
MCH: 23.1 pg — ABNORMAL LOW (ref 26.0–34.0)
MCHC: 31.1 g/dL (ref 30.0–36.0)
MCV: 74.3 fL — ABNORMAL LOW (ref 80.0–100.0)
Monocytes Absolute: 0.7 10*3/uL (ref 0.1–1.0)
Monocytes Relative: 8 %
Neutro Abs: 5.6 10*3/uL (ref 1.7–7.7)
Neutrophils Relative %: 69 %
Platelets: 301 10*3/uL (ref 150–400)
RBC: 6.02 MIL/uL — ABNORMAL HIGH (ref 3.87–5.11)
RDW: 15.1 % (ref 11.5–15.5)
WBC: 8.3 10*3/uL (ref 4.0–10.5)
nRBC: 0 % (ref 0.0–0.2)

## 2024-02-08 LAB — LIPASE, BLOOD
Lipase: 21 U/L (ref 11–51)
Lipase: <10 U/L — ABNORMAL LOW (ref 11–51)

## 2024-02-08 LAB — CBG MONITORING, ED: Glucose-Capillary: 72 mg/dL (ref 70–99)

## 2024-02-08 LAB — HCG, QUANTITATIVE, PREGNANCY: hCG, Beta Chain, Quant, S: <1 m[IU]/mL (ref <5)

## 2024-02-08 LAB — HIV ANTIBODY (ROUTINE TESTING W REFLEX): HIV Screen 4th Generation wRfx: NONREACTIVE

## 2024-02-08 LAB — HCG, SERUM, QUALITATIVE: Preg, Serum: NEGATIVE

## 2024-02-08 MED ORDER — KETOROLAC TROMETHAMINE 15 MG/ML IJ SOLN
15.0000 mg | Freq: Once | INTRAMUSCULAR | Status: AC
  Administered 2024-02-08: 15 mg via INTRAVENOUS
  Filled 2024-02-08: qty 1

## 2024-02-08 MED ORDER — ONDANSETRON 4 MG PO TBDP
4.0000 mg | ORAL_TABLET | Freq: Once | ORAL | Status: AC
  Administered 2024-02-08: 4 mg via ORAL
  Filled 2024-02-08: qty 1

## 2024-02-08 MED ORDER — SODIUM CHLORIDE 0.9 % IV BOLUS
500.0000 mL | Freq: Once | INTRAVENOUS | Status: AC
  Administered 2024-02-08: 500 mL via INTRAVENOUS

## 2024-02-08 MED ORDER — WEGOVY 0.25 MG/0.5ML ~~LOC~~ SOAJ: 0.2500 mg | SUBCUTANEOUS | 4 refills | Status: DC

## 2024-02-08 MED ORDER — ONDANSETRON 4 MG PO TBDP: 4.0000 mg | ORAL_TABLET | Freq: Three times a day (TID) | ORAL | 0 refills | Status: DC | PRN

## 2024-02-08 MED ORDER — ONDANSETRON 4 MG PO TBDP
4.0000 mg | ORAL_TABLET | Freq: Once | ORAL | Status: DC
  Filled 2024-02-08: qty 1

## 2024-02-08 MED ORDER — ONDANSETRON HCL 4 MG/2ML IJ SOLN
4.0000 mg | Freq: Once | INTRAMUSCULAR | Status: AC
Start: 2024-02-08 — End: 2024-02-08
  Administered 2024-02-08: 4 mg via INTRAVENOUS
  Filled 2024-02-08: qty 2

## 2024-02-08 NOTE — Telephone Encounter (Signed)
 I sent a prescription for [facility], she needs to start from the starting dose of 0.25 mg weekly for 4 weeks and then increase to 0.5 mg weekly.  In regard to letter, I would recommend to get from the ER provider.  Jenna Karthik Whittinghill, MD [facility] [address] [place], [address] [place], [place] [address] Phone # [phone]

## 2024-02-08 NOTE — ED Notes (Signed)
 Pt aware of need for urine sample. Pt denies any urge to urinate. Bladder scan performed, 190 mL. EDP notified.

## 2024-02-08 NOTE — Discharge Instructions (Addendum)

## 2024-02-08 NOTE — ED Triage Notes (Signed)
 Started n/v since yesterday. C/o lower abd pain. Started taking Wegovy for weight loss. Since starting the medication with lots of vomiting, and inability to keep any liquids down. No diarrhea, no fevers, and no general body aches.   Blood work started at [facility] today but left w/o being seen d/t wait times.

## 2024-02-08 NOTE — ED Triage Notes (Addendum)
 Pt was started on a new medication called Wegovy for weight loss by her endocrinologist. She mentions she woke up tonight after starting the medication with lots of vomiting, and inability to keep any liquids down. No diarrhea, no fevers, and no general body aches. She does mention feeling a little weak and having some abdominal pain after all the episodes of vomiting but is otherwise stable and cooperative at this time with the triage process.

## 2024-02-08 NOTE — ED Notes (Signed)
 Pt aware of need for urine sample, pt unable to provide at this time.

## 2024-02-08 NOTE — Telephone Encounter (Signed)
 Copied from CRM [id number]. Topic: Clinical - Red Word Triage >> Feb 08, 2024 12:40 PM Shon Hale wrote: Red Word that prompted transfer to Nurse Triage: Patient vomiting non stop, unable to urinate.Patient also feels very weak.  Sx started after taking wegovy 2.4, yesterday morning.  Chief Complaint: severe vomiting and abd pain  pain Symptoms: lightheadedness Frequency: yesterday am  Pertinent Negatives: Patient denies diarrhea Disposition: [x] ED /[] Urgent Care (no appt availability in office) / [] Appointment(In office/virtual)/ []  [facility]/ [] Home Care/ [] Refused Recommended Disposition /[] [facility]/ []  Follow-up with PCP Additional Notes: advised to call 911 pt c/o long wait in ED and was not seen not sure if she went  Reason for Disposition  [1] SEVERE vomiting (e.g., 6 or more times/day) AND [2] present > 8 hours (Exception: Patient sounds well, is drinking liquids, does not sound dehydrated, and vomiting has lasted less than 24 hours.)  Answer Assessment - Initial Assessment Questions 1. VOMITING SEVERITY: "How many times have you vomited in the past 24 hours?"     - MILD:  1 - 2 times/day    - MODERATE: 3 - 5 times/day, decreased oral intake without significant weight loss or symptoms of dehydration    - SEVERE: 6 or more times/day, vomits everything or nearly everything, with significant weight loss, symptoms of dehydration      severe 2. ONSET: "When did the vomiting begin?"      Yesterday am  3. FLUIDS: "What fluids or food have you vomited up today?" "Have you been able to keep any fluids down?"     Water ginger ale  4. ABDOMEN PAIN: "Are your having any abdomen pain?" If Yes : "How bad is it and what does it feel like?" (e.g., crampy, dull, intermittent, constant)      Yes cramping across lower abdomen 5. DIARRHEA: "Is there any diarrhea?" If Yes, ask: "How many times today?"      no  7. CAUSE: "What do you think is causing your vomiting?"     Wegovy  dose "too high" 8. HYDRATION STATUS: "Any signs of dehydration?" (e.g., dry mouth [not only dry lips], too weak to stand) "When did you last urinate?"     Last urinated yesterday am , weakness, mouth dry and headache, lightheadedness 9. OTHER SYMPTOMS: "Do you have any other symptoms?" (e.g., fever, headache, vertigo, vomiting blood or coffee grounds, recent head injury)     no  Protocols used: Vomiting-A-AH

## 2024-02-08 NOTE — ED Notes (Signed)
 Pt unable to provide urine sample, multiple attempts performed. EDP notified and aware.

## 2024-02-08 NOTE — ED Notes (Signed)
Pt stated she was leaving.  

## 2024-02-08 NOTE — ED Provider Notes (Signed)
 [facility] EMERGENCY DEPARTMENT AT [facility] Provider Note   CSN: [id number] Arrival date & time: 02/08/24  1317     History  Chief Complaint  Patient presents with   Abdominal Pain    Jenna Santos is a 18 y.o. female with PMHx ADHD, dysmenorrhea who presents to ED concerned for nausea, vomiting, and lower abdominal cramping/pain since 5AM yesterday morning. States that she has not has a BM in 24 hours. Patient stating that she got her first shot of Wegovy 2 days ago and symptoms started the next morning. Patient also endorsing history of dysmenorrhea which had been better controlled recently with IUD.  Denies fever, dysuria, hematuria, hematochezia. LMP starting Monday - patient is currently on menstrual cycle. Patient also endorses having yellowish vaginal discharge for "awhile".    Abdominal Pain      Home Medications Prior to Admission medications   Medication Sig Start Date End Date Taking? Authorizing Provider  ondansetron (ZOFRAN-ODT) 4 MG disintegrating tablet Take 1 tablet (4 mg total) by mouth every 8 (eight) hours as needed for nausea. 02/08/24  Yes Valrie Hart F, PA-C  cetirizine (ZYRTEC) 10 MG tablet Take 1 tablet (10 mg total) by mouth daily. 09/12/23 02/03/24  Estelle June, NP  EPINEPHrine (EPIPEN 2-PAK) 0.3 mg/0.3 mL IJ SOAJ injection Inject 0.3 mg into the muscle as needed for anaphylaxis. 09/12/23   Klett, Pascal Lux, NP  fluticasone (FLONASE) 50 MCG/ACT nasal spray Place 2 sprays into both nostrils daily. 09/12/23 02/03/24  Estelle June, NP  lamoTRIgine (LAMICTAL) 150 MG tablet Take 150 mg by mouth at bedtime. 01/06/24   [provider]  levonorgestrel (MIRENA) 20 MCG/DAY IUD 1 each by Intrauterine route once.    [provider]  lurasidone (LATUDA) 40 MG TABS tablet Take 40 mg by mouth daily. 01/19/24   [provider]  methylphenidate (RITALIN) 5 MG tablet Take 1 tablet (5 mg total) by mouth daily after lunch. 01/21/23  10/27/23  Georges Mouse, NP  methylphenidate 36 MG PO CR tablet Take 1 tablet (36 mg total) by mouth daily with breakfast. 12/28/23   Georges Mouse, NP  Semaglutide-Weight Management (WEGOVY) 0.25 MG/0.5ML SOAJ Inject 0.25 mg into the skin once a week. Take 0.25 mg weekly for 4 weeks and increase to 0.5 mg weekly. 02/08/24   Thapa, [place], MD      Allergies    Banana    Review of Systems   Review of Systems  Gastrointestinal:  Positive for abdominal pain.    Physical Exam Updated Vital Signs BP 91/70   Pulse 87   Temp 97.6 F (36.4 C) (Oral)   Resp 18   SpO2 99%  Physical Exam Vitals and nursing note reviewed.  Constitutional:      General: She is not in acute distress.    Appearance: She is not ill-appearing or toxic-appearing.  HENT:     Head: Normocephalic and atraumatic.     Mouth/Throat:     Mouth: Mucous membranes are moist.     Pharynx: No posterior oropharyngeal erythema.  Eyes:     General: No scleral icterus.       Right eye: No discharge.        Left eye: No discharge.     Conjunctiva/sclera: Conjunctivae normal.  Cardiovascular:     Rate and Rhythm: Normal rate and regular rhythm.     Pulses: Normal pulses.     Heart sounds: Normal heart sounds. No murmur heard. Pulmonary:  Effort: Pulmonary effort is normal. No respiratory distress.     Breath sounds: Normal breath sounds. No wheezing, rhonchi or rales.  Abdominal:     General: Abdomen is flat. There is no distension.     Palpations: Abdomen is soft. There is no mass.     Tenderness: There is abdominal tenderness.     Comments: Lower abdominal quadrant tenderness to palpation  Genitourinary:    Comments: RN Refugio Mcconico present for pelvic exam - External: without ulcers, lesions, or lacerations - Vaginal Vault: without laceration, or foreign body - Cervix: without discoloration, friability, or masses - Os: nulliparous; closed; some blood present and oozing from os without pulsatile  flow Musculoskeletal:     Right lower leg: No edema.     Left lower leg: No edema.  Skin:    General: Skin is warm and dry.     Findings: No rash.  Neurological:     General: No focal deficit present.     Mental Status: She is alert. Mental status is at baseline.  Psychiatric:        Mood and Affect: Mood normal.     ED Results / Procedures / Treatments   Labs (all labs ordered are listed, but only abnormal results are displayed) Labs Reviewed  COMPREHENSIVE METABOLIC PANEL - Abnormal; Notable for the following components:      Result Value   Glucose, Bld 68 (*)    All other components within normal limits  LIPASE, BLOOD - Abnormal; Notable for the following components:   Lipase <10 (*)    All other components within normal limits  CBC WITH DIFFERENTIAL/PLATELET - Abnormal; Notable for the following components:   RBC 6.02 (*)    MCV 74.3 (*)    MCH 23.1 (*)    All other components within normal limits  WET PREP, GENITAL  HCG, QUANTITATIVE, PREGNANCY  HIV ANTIBODY (ROUTINE TESTING W REFLEX)  RPR  URINALYSIS, W/ REFLEX TO CULTURE (INFECTION SUSPECTED)  CBG MONITORING, ED  GC/CHLAMYDIA PROBE AMP ([place]) NOT AT [facility]    EKG None  Radiology No results found.  Procedures Procedures    Medications Ordered in ED Medications  ondansetron (ZOFRAN) injection 4 mg (4 mg Intravenous Given 02/08/24 1543)  sodium chloride 0.9 % bolus 500 mL (0 mLs Intravenous Stopped 02/08/24 1814)  ketorolac (TORADOL) 15 MG/ML injection 15 mg (15 mg Intravenous Given 02/08/24 1649)  ondansetron (ZOFRAN-ODT) disintegrating tablet 4 mg (4 mg Oral Given 02/08/24 2044)    ED Course/ Medical Decision Making/ A&P                                 Medical Decision Making Amount and/or Complexity of Data Reviewed Labs: ordered.  Risk Prescription drug management.    This patient presents to the ED for concern of abdominal pain, this involves an extensive number of treatment options,  and is a complaint that carries with it a high risk of complications and morbidity.  The differential diagnosis includes gastroenteritis, colitis, small bowel obstruction, appendicitis, cholecystitis, pancreatitis, nephrolithiasis, UTI, pyleonephritis, ruptured ectopic pregnancy, PID, ovarian torsion.   Co morbidities that complicate the patient evaluation  ADHD, dysmenorrhea   Additional history obtained:  Dr. Janene Harvey PCP   Problem List / ED Course / Critical interventions / Medication management  Patient presented for lower abdominal pain/cramping and nausea/vomiting.  Patient endorses history of dysmenorrhea but states that has been better ever since getting  her IUD. Patient has been on her period for 2 days now. Patient also recently receiving her first [place] shot -she states that they started her on the highest dose and is wondering if this is contributing to her symptoms.  Last BM yesterday. I Ordered, and personally interpreted labs.  CMP unremarkable.  Lipase within normal limits.  CBC without leukocytosis or anemia.  CBG 72.  hCG negative.  Wet prep unremarkable. Offered patient further imaging with [place] which she declined. Patient stating that she is able to follow closely with her outpatient GYN. Offered to test patient's urine despite her lack of UTI symptoms - patient initially agreed but then decided against it. Patient requesting to go home before we were able to collect a UA.  Educated patient that her other STI workup will result and she will need to follow-up with the Department of Public Health for these results.  I am less concerned for STDs vs PID given lack of WBC on wet prep. It seems that patient's symptoms are a combination of her dysmenorrhea and recent Wegovy injection. Abdomen visualized without concern for injection site reactions. Shared all results with patient and answered all of patient's questions. Patient tolerating PO intake in ED and requesting Zofran to help  further control her nausea and vomiting.  I have sent Zofran to patient's pharmacy.  Also educated patient on alternating ibuprofen and Tylenol for pain control.  Recommended following up with outpatient GYN and PCP.  Patient verbalized understanding of plan. I have reviewed the patients home medicines and have made adjustments as needed Patient afebrile with stable vitals.  Provided with return precautions.  Discharged in good condition.  Ddx: These are considered less likely due to history of present illness and physical exam. -small bowel obstruction: Last BM yesterday  -appendicitis: pain not localized to RLQ; no leukocytosis; vitals stable.  -cholecystitis: Negative Murphy sign; liver enzymes within normal limits  -pancreatitis: No LUQ tenderness to palpation, lipase within normal limits  -nephrolithiasis: Denies flank pain and urinary complaints  -UTI/pyelonephritis: Denies urinary complaints    Social Determinants of Health:  none          Final Clinical Impression(s) / ED Diagnoses Final diagnoses:  Dysmenorrhea    Rx / DC Orders ED Discharge Orders          Ordered    ondansetron (ZOFRAN-ODT) 4 MG disintegrating tablet  Every 8 hours PRN        02/08/24 2042              Dorthy Cooler, [place] 02/08/24 2340    Franne Forts, DO 02/09/24 2009

## 2024-02-09 LAB — GC/CHLAMYDIA PROBE AMP (~~LOC~~) NOT AT ARMC
Chlamydia: NEGATIVE
Comment: NEGATIVE
Comment: NORMAL
Neisseria Gonorrhea: NEGATIVE

## 2024-02-09 LAB — RPR: RPR Ser Ql: NONREACTIVE

## 2024-02-14 ENCOUNTER — Other Ambulatory Visit (HOSPITAL_COMMUNITY): Payer: Self-pay

## 2024-02-15 DIAGNOSIS — F411 Generalized anxiety disorder: Secondary | ICD-10-CM | POA: Diagnosis not present

## 2024-02-16 ENCOUNTER — Other Ambulatory Visit (HOSPITAL_COMMUNITY): Payer: Self-pay | Attending: Medical Genetics

## 2024-02-20 DIAGNOSIS — F411 Generalized anxiety disorder: Secondary | ICD-10-CM | POA: Diagnosis not present

## 2024-02-21 ENCOUNTER — Other Ambulatory Visit: Payer: Self-pay | Admitting: Family

## 2024-02-21 ENCOUNTER — Telehealth: Payer: Self-pay | Admitting: Family

## 2024-02-21 MED ORDER — METHYLPHENIDATE HCL ER 36 MG PO TB24: 36.0000 mg | ORAL_TABLET | Freq: Every day | ORAL | 0 refills | Status: DC

## 2024-02-21 NOTE — Telephone Encounter (Signed)
 Patient has requested a location change for preferred pharmacy. Patient requested for meds to be sent to [facility] at [address] [place], [place] [address]. Please notify patient when rx has been sent to new pharmacy. Thank you!    RX: methylphenidate 36 MG PO CR tablet

## 2024-02-22 ENCOUNTER — Ambulatory Visit: Payer: Medicaid Other | Admitting: Cardiovascular Disease

## 2024-02-22 DIAGNOSIS — F411 Generalized anxiety disorder: Secondary | ICD-10-CM | POA: Diagnosis not present

## 2024-02-27 DIAGNOSIS — F411 Generalized anxiety disorder: Secondary | ICD-10-CM | POA: Diagnosis not present

## 2024-02-29 DIAGNOSIS — F411 Generalized anxiety disorder: Secondary | ICD-10-CM | POA: Diagnosis not present

## 2024-03-01 ENCOUNTER — Other Ambulatory Visit: Payer: Self-pay

## 2024-03-01 MED ORDER — WEGOVY 0.5 MG/0.5ML ~~LOC~~ SOAJ: 0.5000 mg | SUBCUTANEOUS | 2 refills | Status: DC

## 2024-03-07 DIAGNOSIS — F411 Generalized anxiety disorder: Secondary | ICD-10-CM | POA: Diagnosis not present

## 2024-03-12 MED ORDER — HYDROXYZINE HCL 25 MG PO TABS: 25.0000 mg | ORAL_TABLET | Freq: Three times a day (TID) | ORAL | 2 refills | Status: DC | PRN

## 2024-03-14 DIAGNOSIS — F411 Generalized anxiety disorder: Secondary | ICD-10-CM | POA: Diagnosis not present

## 2024-03-20 MED ORDER — TRIAMCINOLONE ACETONIDE 0.025 % EX OINT: 1.0000 | TOPICAL_OINTMENT | Freq: Two times a day (BID) | CUTANEOUS | 0 refills | Status: AC

## 2024-03-20 NOTE — Addendum Note (Signed)
 Addended by: Wyvonnia Lora on: 03/20/2024 03:25 PM   Modules accepted: Orders

## 2024-03-21 ENCOUNTER — Other Ambulatory Visit: Payer: Self-pay | Admitting: Pediatrics

## 2024-03-21 DIAGNOSIS — F411 Generalized anxiety disorder: Secondary | ICD-10-CM | POA: Diagnosis not present

## 2024-03-21 MED ORDER — CETIRIZINE HCL 10 MG PO TABS
10.0000 mg | ORAL_TABLET | Freq: Every day | ORAL | 2 refills | Status: DC
Start: 2024-03-21 — End: 2024-04-05

## 2024-03-21 MED ORDER — FLUTICASONE PROPIONATE 50 MCG/ACT NA SUSP: 2.0000 | Freq: Every day | NASAL | 12 refills | Status: DC

## 2024-03-28 DIAGNOSIS — F411 Generalized anxiety disorder: Secondary | ICD-10-CM | POA: Diagnosis not present

## 2024-03-29 ENCOUNTER — Telehealth: Admitting: Physician Assistant

## 2024-03-29 DIAGNOSIS — J302 Other seasonal allergic rhinitis: Secondary | ICD-10-CM | POA: Diagnosis not present

## 2024-03-29 DIAGNOSIS — R062 Wheezing: Secondary | ICD-10-CM | POA: Diagnosis not present

## 2024-03-29 MED ORDER — ALBUTEROL SULFATE HFA 108 (90 BASE) MCG/ACT IN AERS: 1.0000 | INHALATION_SPRAY | Freq: Four times a day (QID) | RESPIRATORY_TRACT | 0 refills | Status: AC | PRN

## 2024-03-29 NOTE — Progress Notes (Signed)
 E visit for Allergic Rhinitis We are sorry that you are not feeling well.  Here is how we plan to help!  Based on what you have shared with me it looks like you have Allergic Rhinitis.  Rhinitis is when a reaction occurs that causes nasal congestion, runny nose, sneezing, and itching.  Most types of rhinitis are caused by an inflammation and are associated with symptoms in the eyes ears or throat. There are several types of rhinitis.  The most common are acute rhinitis, which is usually caused by a viral illness, allergic or seasonal rhinitis, and nonallergic or year-round rhinitis.  Nasal allergies occur certain times of the year.  Allergic rhinitis is caused when allergens in the air trigger the release of histamine in the body.  Histamine causes itching, swelling, and fluid to build up in the fragile linings of the nasal passages, sinuses and eyelids.  An itchy nose and clear discharge are common.  I recommend the following over the counter treatments: You should take a daily dose of antihistamine -- I would switch to OTC Xyzal 5 mg once daily  I also would recommend a nasal spray: Flonase 2 sprays into each nostril once daily -- Please continue this as I see it is on your medication list.  I have also prescribed an albuterol inhaler to help relax your airway that is likely reactive due to all the allergens.    HOME CARE:  You can use an over-the-counter saline nasal spray as needed Avoid areas where there is heavy dust, mites, or molds Stay indoors on windy days during the pollen season Keep windows closed in home, at least in bedroom; use air conditioner. Use high-efficiency house air filter Keep windows closed in car, turn AC on re-circulate Avoid playing out with dog during pollen season  GET HELP RIGHT AWAY IF:  If your symptoms do not improve within 10 days You become short of breath You develop yellow or green discharge from your nose for over 3 days You have coughing  fits  MAKE SURE YOU:  Understand these instructions Will watch your condition Will get help right away if you are not doing well or get worse  Thank you for choosing an e-visit. Your e-visit answers were reviewed by a board certified advanced clinical practitioner to complete your personal care plan. Depending upon the condition, your plan could have included both over the counter or prescription medications. Please review your pharmacy choice. Be sure that the pharmacy you have chosen is open so that you can pick up your prescription now.  If there is a problem you may message your provider in MyChart to have the prescription routed to another pharmacy. Your safety is important to us . If you have drug allergies check your prescription carefully.  For the next 24 hours, you can use MyChart to ask questions about today's visit, request a non-urgent call back, or ask for a work or school excuse from your e-visit provider. You will get an email in the next two days asking about your experience. I hope that your e-visit has been valuable and will speed your recovery.

## 2024-03-29 NOTE — Progress Notes (Signed)
 I have spent 5 minutes in review of e-visit questionnaire, review and updating patient chart, medical decision making and response to patient.   Piedad Climes, PA-C

## 2024-04-04 DIAGNOSIS — F411 Generalized anxiety disorder: Secondary | ICD-10-CM | POA: Diagnosis not present

## 2024-04-05 ENCOUNTER — Other Ambulatory Visit: Payer: Self-pay | Admitting: Pediatrics

## 2024-04-05 MED ORDER — LEVOCETIRIZINE DIHYDROCHLORIDE 5 MG PO TABS: 5.0000 mg | ORAL_TABLET | Freq: Every day | ORAL | 2 refills | Status: DC

## 2024-04-05 MED ORDER — CARBINOXAMINE MALEATE ER 4 MG/5ML PO SUER: 10.0000 mL | Freq: Every evening | ORAL | 0 refills | Status: DC | PRN

## 2024-04-05 NOTE — Progress Notes (Signed)
 Cardiology Office Note:   Date:  04/09/2024  ID:  Jenna Santos, DOB 08-13-2006, MRN [id number] PCP:  Rayann Cage, NP  [facility] Providers Cardiologist:  Alyssa Backbone, MD Referring MD: Rayann Cage, NP  Chief Complaint/Reason for Referral: Dizziness ASSESSMENT:    1. Dizziness   2. Prediabetes   3. Polycystic ovarian disease   4. BMI 40.0-44.9, adult (HCC)   5. Snoring     PLAN:   In order of problems listed above: Dizziness: Positional blood pressures did not indicate POTS syndrome or orthostatic hypotension.  Will obtain echocardiogram and monitor to evaluate further. Prediabetes: Per PCP. Polycystic ovarian disease: Per PCP.   Elevated BMI: Continue semaglutide  0.5 mg weekly Snoring:  Given daytime somnolence, nonrestorative sleep, and early morning headaches will obtain sleep study.            Dispo:  Return if symptoms worsen or fail to improve.      Medication Adjustments/Labs and Tests Ordered: Current medicines are reviewed at length with the patient today.  Concerns regarding medicines are outlined above.  The following changes have been made:  no change   Labs/tests ordered: Orders Placed This Encounter  Procedures   LONG TERM MONITOR (3-14 DAYS)   EKG 12-Lead   ECHOCARDIOGRAM COMPLETE   Home sleep test    Medication Changes: No orders of the defined types were placed in this encounter.   Current medicines are reviewed at length with the patient today.  The patient does not have concerns regarding medicines.     History of Present Illness:      FOCUSED PROBLEM LIST:   Prediabetes Polycystic ovarian disease ADHD BMI 41  April 2025:  Patient consents to use of AI scribe. The patient is a 18 year old female with the above listed medical problems here for recommendations regarding dizziness.  The patient saw neurology recently.  She had an MRI of her brain which was unremarkable.  She noted palpitations and shortness of  breath.  She experiences dizzy spells a couple of times a week during routine activities such as walking or doing household chores. These episodes are characterized by feeling 'super dizzy and super lightheaded' with sensations of being hot and flushed, and a sense of impending fainting. There are no identifiable triggers, and they are not associated with stress, pain, or bad news. Sitting down provides some relief, but she does not feel completely better afterwards. No complete loss of consciousness has occurred during these episodes.  She feels constantly out of breath, experiences daytime sleepiness, particularly in the afternoon, and has morning headaches. She is unsure if she snores but mentions heavy breathing at night. She also notes difficulty breathing when lying flat and requires a pillow to sleep comfortably. No chest pain or leg swelling is reported.  Her current medications include Wegovy  for weight management, hydroxyzine  as needed (approximately once a month), and allergy medication daily. She is prescribed Latuda and ADHD medication but admits to inconsistent use of these medications.  Socially, she is a Tree surgeon speech language pathology at [employer] and lives [place] campus. She works as a Administrator, arts for special needs individuals. She denies smoking or alcohol use.          Current Medications: Current Meds  Medication Sig   albuterol  (VENTOLIN  HFA) 108 (90 Base) MCG/ACT inhaler Inhale 1-2 puffs into the lungs every 6 (six) hours as needed for wheezing or shortness of breath.   Carbinoxamine  Maleate ER 4 MG/5ML SUER Take  10 mLs by mouth at bedtime as needed.   EPINEPHrine  (EPIPEN  2-PAK) 0.3 mg/0.3 mL IJ SOAJ injection Inject 0.3 mg into the muscle as needed for anaphylaxis.   fluticasone  (FLONASE ) 50 MCG/ACT nasal spray Place 2 sprays into both nostrils daily.   lamoTRIgine (LAMICTAL) 150 MG tablet Take 150 mg by mouth at bedtime.   levocetirizine (XYZAL ) 5 MG  tablet Take 1 tablet (5 mg total) by mouth daily.   levonorgestrel  (MIRENA ) 20 MCG/DAY IUD 1 each by Intrauterine route once.   lurasidone (LATUDA) 40 MG TABS tablet Take 40 mg by mouth daily.   methylphenidate  36 MG PO CR tablet Take 1 tablet (36 mg total) by mouth daily with breakfast.   ondansetron  (ZOFRAN -ODT) 4 MG disintegrating tablet Take 1 tablet (4 mg total) by mouth every 8 (eight) hours as needed for nausea.   Semaglutide -Weight Management (WEGOVY ) 0.5 MG/0.5ML SOAJ Inject 0.5 mg into the skin once a week.     Review of Systems:   Please see the history of present illness.    All other systems reviewed and are negative.     EKGs/Labs/Other Test Reviewed:   EKG: 2024 sinus tachycardia  EKG Interpretation Date/Time:  Monday April 09 2024 11:13:06 EDT Ventricular Rate:  82 PR Interval:  134 QRS Duration:  64 QT Interval:  348 QTC Calculation: 406 R Axis:   49  Text Interpretation: Normal sinus rhythm with sinus arrhythmia Normal ECG When compared with ECG of 09-Feb-2023 15:33, PREVIOUS ECG IS PRESENT Confirmed by Alyssa Backbone ([id number]) on 04/09/2024 11:25:32 AM         Risk Assessment/Calculations:          Physical Exam:   VS:  BP 120/70 (BP Location: Right Arm, Patient Position: Sitting, Cuff Size: Normal)   Pulse 79   Resp 16   Ht 5\' 2"  (1.575 m)   Wt 226 lb (102.5 kg)   SpO2 99%   BMI 41.34 kg/m        Wt Readings from Last 3 Encounters:  04/09/24 226 lb (102.5 kg) (99%, Z= 2.26)*  02/03/24 226 lb (102.5 kg) (99%, Z= 2.26)*  01/16/24 228 lb (103.4 kg) (99%, Z= 2.28)*   * Growth percentiles are based on CDC (Girls, 2-20 Years) data.      GENERAL:  No apparent distress, AOx3 HEENT:  No carotid bruits, +2 carotid impulses, no scleral icterus CAR: RRR no murmurs, gallops, rubs, or thrills RES:  Clear to auscultation bilaterally ABD:  Soft, nontender, nondistended, positive bowel sounds x 4 VASC:  +2 radial pulses, +2 carotid pulses NEURO:  CN 2-12  grossly intact; motor and sensory grossly intact PSYCH:  No active depression or anxiety EXT:  No edema, ecchymosis, or cyanosis  Signed, Elza Varricchio K Kriti Katayama, MD  04/09/2024 12:11 PM    [facility] [address] [place], [place], [place]  [address] Phone: [phone]; Fax: [phone]   Note:  This document was prepared using Dragon voice recognition software and may include unintentional dictation errors.

## 2024-04-09 ENCOUNTER — Encounter: Payer: Self-pay | Admitting: Internal Medicine

## 2024-04-09 ENCOUNTER — Ambulatory Visit: Attending: Internal Medicine | Admitting: Internal Medicine

## 2024-04-09 VITALS — BP 120/70 | HR 79 | Resp 16 | Ht 62.0 in | Wt 226.0 lb

## 2024-04-09 DIAGNOSIS — R42 Dizziness and giddiness: Secondary | ICD-10-CM | POA: Diagnosis not present

## 2024-04-09 DIAGNOSIS — R0683 Snoring: Secondary | ICD-10-CM | POA: Insufficient documentation

## 2024-04-09 DIAGNOSIS — Z6841 Body Mass Index (BMI) 40.0 and over, adult: Secondary | ICD-10-CM | POA: Diagnosis not present

## 2024-04-09 DIAGNOSIS — R7303 Prediabetes: Secondary | ICD-10-CM | POA: Diagnosis not present

## 2024-04-09 DIAGNOSIS — E282 Polycystic ovarian syndrome: Secondary | ICD-10-CM | POA: Diagnosis not present

## 2024-04-09 NOTE — Patient Instructions (Addendum)
 Medication Instructions:  Your physician recommends that you continue on your current medications as directed. Please refer to the Current Medication list given to you today.  *If you need a refill on your cardiac medications before your next appointment, please call your pharmacy*  Testing/Procedures: Your physician has requested that you wear a Zio heart monitor for 7 days. This will be mailed to your home with instructions on how to apply the monitor and how to return it when finished. Please allow 2 weeks after returning the heart monitor before our office calls you with the results.   Your physician has requested that you have an echocardiogram. Echocardiography is a painless test that uses sound waves to create images of your heart. It provides your doctor with information about the size and shape of your heart and how well your heart's chambers and valves are working. This procedure takes approximately one hour. There are no restrictions for this procedure. Please do NOT wear cologne, perfume, aftershave, or lotions (deodorant is allowed). Please arrive 15 minutes prior to your appointment time.  Please note: We ask at that you not bring children with you during ultrasound (echo/ vascular) testing. Due to room size and safety concerns, children are not allowed in the ultrasound rooms during exams. Our front office staff cannot provide observation of children in our lobby area while testing is being conducted. An adult accompanying a patient to their appointment will only be allowed in the ultrasound room at the discretion of the ultrasound technician under special circumstances. We apologize for any inconvenience.  Your physician has recommended that you have a sleep study. This test records several body functions during sleep, including: brain activity, eye movement, oxygen and carbon dioxide blood levels, heart rate and rhythm, breathing rate and rhythm, the flow of air through your mouth and  nose, snoring, body muscle movements, and chest and belly movement.   Follow-Up: At [facility], you and your health needs are our priority.  As part of our continuing mission to provide you with exceptional heart care, our providers are all part of one team.  This team includes your primary Cardiologist (physician) and Advanced Practice Providers or APPs (Physician Assistants and Nurse Practitioners) who all work together to provide you with the care you need, when you need it.  Your next appointment:   As needed  Provider:   Arun K Thukkani, MD  Other Instructions Delane Fear- Long Term Monitor Instructions     Your physician has requested you wear a ZIO patch monitor for 7 days.  This is a single patch monitor. [employer] supplies one patch monitor per enrollment. Additional  stickers are not available. Please do not apply patch if you will be having a Nuclear Stress Test,  Echocardiogram, Cardiac CT, MRI, or Chest Xray during the period you would be wearing the  monitor. The patch cannot be worn during these tests. You cannot remove and re-apply the  ZIO XT patch monitor.  Your ZIO patch monitor will be mailed 3 day USPS to your address on file. It may take 3-5 days  to receive your monitor after you have been enrolled.  Once you have received your monitor, please review the enclosed instructions. Your monitor  has already been registered assigning a specific monitor serial # to you.     Billing and Patient Assistance Program Information     We have supplied [employer] with any of your insurance information on file for billing purposes.  [employer] offers a  sliding scale Patient Assistance Program for patients that do not have  insurance, or whose insurance does not completely cover the cost of the ZIO monitor.  You must apply for the Patient Assistance Program to qualify for this discounted rate.  To apply, please call [employer] at [phone], select option 4, select option 2, ask to  apply for  Patient Assistance Program. Sanna Crystal will ask your household income, and how many people  are in your household. They will quote your out-of-pocket cost based on that information.  [employer] will also be able to set up a 14-month, interest-free payment plan if needed.     Applying the monitor     Shave hair from upper left chest.  Hold abrader disc by orange tab. Rub abrader in 40 strokes over the upper left chest as  indicated in your monitor instructions.  Clean area with 4 enclosed alcohol pads. Let dry.  Apply patch as indicated in monitor instructions. Patch will be placed under collarbone on left  side of chest with arrow pointing upward.  Rub patch adhesive wings for 2 minutes. Remove [place] label marked "1". Remove the Jewell  label marked "2". Rub patch adhesive wings for 2 additional minutes.  While looking in a mirror, press and release button in center of patch. A small green light will  flash 3-4 times. This will be your only indicator that the monitor has been turned on.  Do not shower for the first 24 hours. You may shower after the first 24 hours.  Press the button if you feel a symptom. You will hear a small click. Record Date, Time and  Symptom in the Patient Logbook.  When you are ready to remove the patch, follow instructions on the last 2 pages of Patient  Logbook. Stick patch monitor onto the last page of Patient Logbook.  Place Patient Logbook in the blue and [place] box. Use locking tab on box and tape box closed  securely. The blue and [place] box has prepaid postage on it. Please place it in the mailbox as  soon as possible. Your physician should have your test results approximately 7 days after the  monitor has been mailed back to [facility].  Call [facility] Customer Care at [phone] if you have questions regarding  your ZIO XT patch monitor. Call them immediately if you see an orange light blinking on your  monitor.  If your monitor falls off  in less than 4 days, contact our Monitor department at [phone].  If your monitor becomes loose or falls off after 4 days call [employer] at [phone] for  suggestions on securing your monitor.

## 2024-04-11 DIAGNOSIS — F411 Generalized anxiety disorder: Secondary | ICD-10-CM | POA: Diagnosis not present

## 2024-04-12 DIAGNOSIS — Z5181 Encounter for therapeutic drug level monitoring: Secondary | ICD-10-CM | POA: Diagnosis not present

## 2024-04-12 DIAGNOSIS — F411 Generalized anxiety disorder: Secondary | ICD-10-CM | POA: Diagnosis not present

## 2024-04-12 DIAGNOSIS — F900 Attention-deficit hyperactivity disorder, predominantly inattentive type: Secondary | ICD-10-CM | POA: Diagnosis not present

## 2024-04-12 DIAGNOSIS — Z79899 Other long term (current) drug therapy: Secondary | ICD-10-CM | POA: Diagnosis not present

## 2024-04-12 DIAGNOSIS — F4312 Post-traumatic stress disorder, chronic: Secondary | ICD-10-CM | POA: Diagnosis not present

## 2024-04-12 DIAGNOSIS — F314 Bipolar disorder, current episode depressed, severe, without psychotic features: Secondary | ICD-10-CM | POA: Diagnosis not present

## 2024-04-16 ENCOUNTER — Telehealth: Payer: Self-pay | Admitting: Internal Medicine

## 2024-04-16 ENCOUNTER — Ambulatory Visit: Attending: Internal Medicine

## 2024-04-16 DIAGNOSIS — R42 Dizziness and giddiness: Secondary | ICD-10-CM

## 2024-04-16 NOTE — Telephone Encounter (Signed)
 Patient stated she have not received her heart monitor as yet and is calling to follow-up on the status of her order.

## 2024-04-16 NOTE — Telephone Encounter (Signed)
 Reviewed the patient's chart.  She was seen by Dr. Lorie Rook on 04/09/24 and an order was placed for a Zio XT heart monitor.   Sent to [employer] in error.  Re-routing call to Monitor Tech and RN who ordered this for further review.

## 2024-04-16 NOTE — Telephone Encounter (Signed)
 I called and spoke with the patient and advised her of the issue with her monitor order and that this should now be resolved.  She is aware that she should receive her monitor within 2-3 business days, but to call the office back if this is not received by the end of the week.  The patient voices understanding and is agreeable.  She was very appreciative of the call back.

## 2024-04-16 NOTE — Progress Notes (Unsigned)
 Enrolled for [employer] to mail a ZIO XT long term holter monitor to the patients address on file.

## 2024-04-16 NOTE — Telephone Encounter (Signed)
 Jenna Santos  Sent: Mon Apr 16, 2024 12:42 PM  To: Annemarie Barry, RN         Message  Order was not processed.  This may have been because of move.  [address] was removed from location choices.  [address] was added 04/09/24 but may have taken awhile to correct previous orders.  I have processed the order.  She should receive it in 2-3 days.  Thanks,  [employer]

## 2024-04-18 DIAGNOSIS — F411 Generalized anxiety disorder: Secondary | ICD-10-CM | POA: Diagnosis not present

## 2024-04-25 DIAGNOSIS — F411 Generalized anxiety disorder: Secondary | ICD-10-CM | POA: Diagnosis not present

## 2024-04-26 DIAGNOSIS — F4312 Post-traumatic stress disorder, chronic: Secondary | ICD-10-CM | POA: Diagnosis not present

## 2024-04-26 DIAGNOSIS — F411 Generalized anxiety disorder: Secondary | ICD-10-CM | POA: Diagnosis not present

## 2024-04-26 DIAGNOSIS — F900 Attention-deficit hyperactivity disorder, predominantly inattentive type: Secondary | ICD-10-CM | POA: Diagnosis not present

## 2024-04-26 DIAGNOSIS — F314 Bipolar disorder, current episode depressed, severe, without psychotic features: Secondary | ICD-10-CM | POA: Diagnosis not present

## 2024-04-30 DIAGNOSIS — F411 Generalized anxiety disorder: Secondary | ICD-10-CM | POA: Diagnosis not present

## 2024-05-01 ENCOUNTER — Encounter: Payer: Self-pay | Admitting: "Endocrinology

## 2024-05-02 ENCOUNTER — Encounter: Payer: Self-pay | Admitting: "Endocrinology

## 2024-05-02 ENCOUNTER — Telehealth (INDEPENDENT_AMBULATORY_CARE_PROVIDER_SITE_OTHER): Admitting: "Endocrinology

## 2024-05-02 DIAGNOSIS — R7989 Other specified abnormal findings of blood chemistry: Secondary | ICD-10-CM

## 2024-05-02 DIAGNOSIS — E282 Polycystic ovarian syndrome: Secondary | ICD-10-CM | POA: Diagnosis not present

## 2024-05-02 DIAGNOSIS — E88819 Insulin resistance, unspecified: Secondary | ICD-10-CM

## 2024-05-02 DIAGNOSIS — R7303 Prediabetes: Secondary | ICD-10-CM

## 2024-05-02 DIAGNOSIS — F411 Generalized anxiety disorder: Secondary | ICD-10-CM | POA: Diagnosis not present

## 2024-05-02 MED ORDER — WEGOVY 1 MG/0.5ML ~~LOC~~ SOAJ: 1.0000 mg | SUBCUTANEOUS | 2 refills | Status: DC

## 2024-05-02 NOTE — Patient Instructions (Signed)
Maintain healthy lifestyle including 1200 Cal/day, 30 min of activity/day, avoiding refined/processed/outside food 20 minutes physical activity per day, in continuum or interruptedly through the day  Goals: less than 60 grams of carbohydrate/meal, 1200-1500 Cal/day, 10,0000 steps a day and weight loss of 0.5-1 lb/ wk  Sleep 7-9 hours/day, adapt good sleep hygiene Adapt de-stressing and relaxation techniques to prevent stress induced weight gain Avoid/switch medications that lead to weight gain by discussing with the prescribing physician

## 2024-05-02 NOTE — Progress Notes (Signed)
 The patient reports they are currently: [name]. I spent 8 minutes on the video with the patient on the date of service. I spent an additional 5 minutes on pre- and post-visit activities on the date of service.   The patient was physically located in [place]  or a state in which I am permitted to provide care. The patient and/or parent/guardian understood that s/he may incur co-pays and cost sharing, and agreed to the telemedicine visit. The visit was reasonable and appropriate under the circumstances given the patient's presentation at the time.  The patient and/or parent/guardian has been advised of the potential risks and limitations of this mode of treatment (including, but not limited to, the absence of in-person examination) and has agreed to be treated using telemedicine. The patient's/patient's family's questions regarding telemedicine have been answered.   The patient and/or parent/guardian has also been advised to contact their provider's office for worsening conditions, and seek emergency medical treatment and/or call 911 if the patient deems either necessary.     Outpatient Endocrinology Note Jenna Newcomer, MD    Jenna Santos [facility] 2006-05-04 [id number]  Referring Provider: Rayann Cage, NP Primary Care Provider: Rayann Cage, NP Reason for consultation: Subjective   Assessment & Plan  Diagnoses and all orders for this visit:  Morbid obesity (HCC)  Elevated morning serum cortisol level -     Dexamethasone , blood; Future -     Cortisol  PCOS (polycystic ovarian syndrome)  Insulin  resistance  Pre-diabetes  Other orders -     Semaglutide -Weight Management (WEGOVY ) 1 MG/0.5ML SOAJ; Inject 1 mg into the skin once a week.    Return in about 4 weeks (around 05/30/2024) for visit and 8 am labs before next visit.  PCOS with morbid obesity, complicated by prediabetes, insulin  resistance, elevated serum cortisol  Not able to tolerate regulars metformin  500 mg every  day due to nausea and feeling drained 2025: Not taking metformin  XR 500mg  two pills for 2 weeks -felt "super sick" due to nausea  No history of MEN syndrome/medullary thyroid  cancer/pancreatitis or pancreatic cancer in self or family Pt interested in weight loss Discussed lifestyle changes, medical management as well as bariatric surgery previously  Maintain healthy lifestyle including 1200 Cal/day, 30 min of activity/day, avoiding refined/processed/outside food 20 minutes physical activity per day, in continuum or interruptedly through the day  Goals: less than 60 grams of carbohydrate/meal, 1200-1500 Cal/day, 10,0000 steps a day and weight loss of 0.5-1 lb/ wk  Sleep 7-9 hours/day, adapt good sleep hygiene Adapt de-stressing and relaxation techniques to prevent stress induced weight gain Avoid/switch medications that lead to weight gain by discussing with the prescribing physician    Trulicity not covered unless fails metformin  On wegovy  1 mg/week - has no side effects  Elevated serum cortisol level in the morning Ordered 1 mg dexamethasone  suppression test again , patient did not do it previously  No history of adrenal adenoma  PCOS based off of history of irregular menstrual cycle, currently on IUD managed by OB/GYN Patient has mild hirsutism  I have reviewed current medications, nurse's notes, allergies, vital signs, past medical and surgical history, family medical history, and social history for this encounter. Counseled patient on symptoms, examination findings, lab findings, imaging results, treatment decisions and monitoring and prognosis. The patient understood the recommendations and agrees with the treatment plan. All questions regarding treatment plan were fully answered.  Return in about 4 weeks (around 05/30/2024) for visit and 8 am labs before next visit.  Jenna Newcomer, MD  05/02/24   History of Present Illness HPI  Jenna Santos is a 18 y.o. year old  female who presents for evaluation of prediabetes.  Saw an endocrinologist in past due to high A1C Limits sugars, "always walking", 3-5 mi/day Never been on steroids  Started weight gain during middle school slowly over time and then again during Covid time due to personal stress   Tried metformin  500 mg every day, stopped due to nausea and feeling drained  On wegovy  0.5 mg/week - has no side effects 2025: Not taking metformin  XR 500mg  two pills for 2 weeks -felt "super sick" due to nausea  Patient questions if she has PCOS.  She meets 2 out of 3 NIH Criteria for PCOS: Irregular menstrual cycles: history of both Oligomenorrhea (infrequent periods) or anovulation (absence of periods). Has IUD now. Clinical or biochemical hyperandrogenism: acne and hirsutism (excessive hair growth) Polycystic ovaries: no ultrasound imaging  Physical Exam  There were no vitals taken for this visit.   Constitutional: well developed, well nourished Head: normocephalic, atraumatic Eyes: sclera anicteric, no redness Neck: supple Lungs: normal respiratory effort Neurology: alert and oriented Skin: dry, no appreciable rashes Musculoskeletal: no appreciable defects Psychiatric: normal mood and affect   Current Medications Patient's Medications  New Prescriptions   SEMAGLUTIDE -WEIGHT MANAGEMENT (WEGOVY ) 1 MG/0.5ML SOAJ    Inject 1 mg into the skin once a week.  Previous Medications   ALBUTEROL  (VENTOLIN  HFA) 108 (90 BASE) MCG/ACT INHALER    Inhale 1-2 puffs into the lungs every 6 (six) hours as needed for wheezing or shortness of breath.   CARBINOXAMINE  MALEATE ER 4 MG/5ML SUER    Take 10 mLs by mouth at bedtime as needed.   EPINEPHRINE  (EPIPEN  2-PAK) 0.3 MG/0.3 ML IJ SOAJ INJECTION    Inject 0.3 mg into the muscle as needed for anaphylaxis.   FLUTICASONE  (FLONASE ) 50 MCG/ACT NASAL SPRAY    Place 2 sprays into both nostrils daily.   LAMOTRIGINE (LAMICTAL) 150 MG TABLET    Take 150 mg by mouth at bedtime.    LEVOCETIRIZINE (XYZAL ) 5 MG TABLET    Take 1 tablet (5 mg total) by mouth daily.   LEVONORGESTREL  (MIRENA ) 20 MCG/DAY IUD    1 each by Intrauterine route once.   LURASIDONE (LATUDA) 40 MG TABS TABLET    Take 40 mg by mouth daily.   METHYLPHENIDATE  36 MG PO CR TABLET    Take 1 tablet (36 mg total) by mouth daily with breakfast.   ONDANSETRON  (ZOFRAN -ODT) 4 MG DISINTEGRATING TABLET    Take 1 tablet (4 mg total) by mouth every 8 (eight) hours as needed for nausea.  Modified Medications   No medications on file  Discontinued Medications   SEMAGLUTIDE -WEIGHT MANAGEMENT (WEGOVY ) 0.5 MG/0.5ML SOAJ    Inject 0.5 mg into the skin once a week.    Allergies Allergies  Allergen Reactions   Banana Anaphylaxis    Past Medical History Past Medical History:  Diagnosis Date   Acanthosis nigricans 10/17/2018   Amenorrhea 03/31/2020   Attention deficit hyperactivity disorder (ADHD) 07/22/2022   Dysmenorrhea 03/15/2022   Elevated hemoglobin A1c 11/07/2018   Obesity    Tinnitus of both ears 09/12/2023    Past Surgical History Past Surgical History:  Procedure Laterality Date   WISDOM TOOTH EXTRACTION Bilateral 2024    Family History family history includes Arthritis in her maternal grandmother; Cancer in her maternal grandfather; Diabetes in her maternal grandfather; Hyperlipidemia in her maternal grandmother; Hypertension  in her maternal grandmother; Stroke in her maternal grandmother.  Social History Social History   Socioeconomic History   Marital status: Single    Spouse name: Not on file   Number of children: Not on file   Years of education: Not on file   Highest education level: Not on file  Occupational History   Not on file  Tobacco Use   Smoking status: Never    Passive exposure: Never   Smokeless tobacco: Never  Vaping Use   Vaping status: Never Used  Substance and Sexual Activity   Alcohol use: No   Drug use: No   Sexual activity: Yes    Birth control/protection:  Condom, I.U.D.  Other Topics Concern   Not on file  Social History Narrative   Lives at home with mom   Father not involved   12th grade at [employer]   Left handed          Social Drivers of Health   Financial Resource Strain: Not on file  Food Insecurity: Not on file  Transportation Needs: Not on file  Physical Activity: Not on file  Stress: Not on file  Social Connections: Not on file  Intimate Partner Violence: Not on file    Lab Results  Component Value Date   CHOL 150 12/21/2023   Lab Results  Component Value Date   HDL 63 12/21/2023   Lab Results  Component Value Date   LDLCALC 76 12/21/2023   Lab Results  Component Value Date   TRIG 37 12/21/2023   Lab Results  Component Value Date   CHOLHDL 2.4 12/21/2023   Lab Results  Component Value Date   CREATININE 0.96 02/08/2024   No results found for: "GFR"    Component Value Date/Time   NA 140 02/08/2024 1532   K 4.9 02/08/2024 1532   CL 104 02/08/2024 1532   CO2 23 02/08/2024 1532   GLUCOSE 68 (L) 02/08/2024 1532   BUN 20 02/08/2024 1532   CREATININE 0.96 02/08/2024 1532   CREATININE 0.85 12/21/2023 0920   CALCIUM 10.0 02/08/2024 1532   PROT 7.9 02/08/2024 1532   ALBUMIN 4.9 02/08/2024 1532   AST 24 02/08/2024 1532   ALT 9 02/08/2024 1532   ALKPHOS 78 02/08/2024 1532   BILITOT 0.6 02/08/2024 1532   GFRNONAA >60 02/08/2024 1532      Latest Ref Rng & Units 02/08/2024    3:32 PM 02/08/2024    1:29 AM 12/21/2023    9:20 AM  BMP  Glucose 70 - 99 mg/dL 68  84  88   BUN 6 - 20 mg/dL 20  15  21    Creatinine 0.44 - 1.00 mg/dL 9.81  1.91  4.78   BUN/Creat Ratio 6 - 22 (calc)   25   Sodium 135 - 145 mmol/L 140  140  140   Potassium 3.5 - 5.1 mmol/L 4.9  4.3  4.9   Chloride 98 - 111 mmol/L 104  105  105   CO2 22 - 32 mmol/L 23  24  27    Calcium 8.9 - 10.3 mg/dL 29.5  9.8  9.4        Component Value Date/Time   WBC 8.3 02/08/2024 1532   RBC 6.02 (H) 02/08/2024 1532   HGB 13.9  02/08/2024 1532   HCT 44.7 02/08/2024 1532   PLT 301 02/08/2024 1532   MCV 74.3 (L) 02/08/2024 1532   MCH 23.1 (L) 02/08/2024 1532   MCHC 31.1 02/08/2024  1532   RDW 15.1 02/08/2024 1532   LYMPHSABS 1.9 02/08/2024 1532   MONOABS 0.7 02/08/2024 1532   EOSABS 0.0 02/08/2024 1532   BASOSABS 0.0 02/08/2024 1532   Lab Results  Component Value Date   TSH 2.08 12/21/2023   TSH 1.54 10/17/2023   TSH 1.23 10/17/2018   FREET4 1.2 12/21/2023   FREET4 1.1 10/17/2023   FREET4 1.1 10/17/2018         Parts of this note may have been dictated using voice recognition software. There may be variances in spelling and vocabulary which are unintentional. Not all errors are proofread. Please notify the [facility] if any discrepancies are noted or if the meaning of any statement is not clear.

## 2024-05-04 ENCOUNTER — Encounter: Payer: Self-pay | Admitting: Internal Medicine

## 2024-05-06 ENCOUNTER — Ambulatory Visit: Payer: Self-pay | Admitting: Internal Medicine

## 2024-05-06 DIAGNOSIS — R42 Dizziness and giddiness: Secondary | ICD-10-CM

## 2024-05-08 ENCOUNTER — Telehealth: Payer: Self-pay

## 2024-05-08 DIAGNOSIS — R0683 Snoring: Secondary | ICD-10-CM

## 2024-05-08 NOTE — Telephone Encounter (Signed)
**Note De-Identified Taydem Cavagnaro Obfuscation** I called [employer] to do a PA for this Home Sleep Test. Per Edwina Gram at [facility] a [place] cannot be done over the phone and can only be done through their portal (Portal is currently down) or to fax clinicals to them at [phone].  I have faxed the pts office visit notes with Dr Lorie Rook from 04/09/24 and her Stop Bang Score to them and I did received confirmation that the fax sent successfully.

## 2024-05-09 DIAGNOSIS — F411 Generalized anxiety disorder: Secondary | ICD-10-CM | POA: Diagnosis not present

## 2024-05-11 ENCOUNTER — Encounter: Payer: Self-pay | Admitting: Neurology

## 2024-05-14 ENCOUNTER — Telehealth (INDEPENDENT_AMBULATORY_CARE_PROVIDER_SITE_OTHER): Payer: Self-pay | Admitting: Neurology

## 2024-05-14 ENCOUNTER — Encounter: Payer: Self-pay | Admitting: Neurology

## 2024-05-14 VITALS — Ht 62.0 in

## 2024-05-14 DIAGNOSIS — R42 Dizziness and giddiness: Secondary | ICD-10-CM | POA: Diagnosis not present

## 2024-05-14 DIAGNOSIS — G43009 Migraine without aura, not intractable, without status migrainosus: Secondary | ICD-10-CM | POA: Diagnosis not present

## 2024-05-14 MED ORDER — GABAPENTIN 100 MG PO TABS: ORAL_TABLET | ORAL | 6 refills | Status: DC

## 2024-05-14 NOTE — Progress Notes (Signed)
 Virtual Visit via Video Note The purpose of this virtual visit is to provide medical care while limiting exposure to the novel coronavirus.    Consent was obtained for video visit:  Yes.   Answered questions that patient had about telehealth interaction:  Yes.   I discussed the limitations, risks, security and privacy concerns of performing an evaluation and management service by telemedicine. I also discussed with the patient that there may be a patient responsible charge related to this service. The patient expressed understanding and agreed to proceed.  Pt location: Workplace Physician Location: office Name of referring provider:  Rayann Cage, NP I connected with Thad Files Cardell at patients initiation/request on 05/14/2024 at  1:00 PM EDT by video enabled telemedicine application and verified that I am speaking with the correct person using two identifiers. Pt MRN:  [id number] Pt DOB:  Jun 05, 2006 Video Participants:  Thad Files Tesar   History of Present Illness:  The patient had a virtual video visit on 05/14/2024. She was last seen 3 months ago for dizziness and headaches. The description of dizziness is suggestive of near syncope where she describes feeling like she would pass out, seeing stars, nausea, tinnitus. BP runs low, on initial visit she had a 10-point drop in SBP from supine to standing (BP standing 86/66). She had seen ENT and reported headaches, who raised concern for vestibular migraines. Her brain MRI is normal. She has seen Cardiology and had a normal holter monitor. She is awaiting an echocardiogram and home sleep study.  She reports dizziness is still happening. She is also having a lot of sharp pains in her head. She has back pain, a little neck pain. No focal numbness/tingling/weakness. No loss of consciousness. Sometimes she goes into a daze 2-3 times a week, but responds when called.    History on Initial Assessment 10/27/2023: This is a pleasant 18 year  old left-handed woman with a history of ADHD presenting for evaluation of dizziness and headaches. She reports symptoms started gradually around 2.5 months ago, she started getting a little dizzy seeing black stars, feeling like she would pass out. This started to get worse and occur on a daily basis with associated nausea, high-pitched ringing in both ears for 5 minutes. They can occur sitting or standing. If she is standing, she would sit and this helps some. There is no associated confusion, speech changes, she just feels drowsy. No focal numbness/tingling/weakness. A month ago, she started having diffuse headaches also lasting for several minutes. Pain would be throbbing all over, no photo/phonophobia. She does not take any pain medication. One time she had to leave work early due to the symptoms, she was nauseated and felt discombobulated. Her friend denies any staring/unresponsive episodes. She keeps hydrated, drinking at least 36-40 oz daily. She usually gets 7 hours of sleep. She denies any alcohol use. No neck/back pain. She had an eye exam in July which was normal. She has occasional palpitations, no chest pain or shortness of breath. She had seen ENT for the symptoms and had normal hearing and vestibular testing, it was noted that some features are suspicious for vestibular migraine. She denies any spinning sensation. She is in college majoring in Speech language pathology.   Her head CT without contrast done 04/2022 after a head injury did not show any acute changes, there was note of a partially empty sella.   Prior migraine preventative medications: Topiramate     Current Outpatient Medications on File Prior to Visit  Medication Sig Dispense Refill   albuterol  (VENTOLIN  HFA) 108 (90 Base) MCG/ACT inhaler Inhale 1-2 puffs into the lungs every 6 (six) hours as needed for wheezing or shortness of breath. 6.7 g 0   Carbinoxamine  Maleate ER 4 MG/5ML SUER Take 10 mLs by mouth at bedtime as needed.  480 mL 0   EPINEPHrine  (EPIPEN  2-PAK) 0.3 mg/0.3 mL IJ SOAJ injection Inject 0.3 mg into the muscle as needed for anaphylaxis. 2 each 6   fluticasone  (FLONASE ) 50 MCG/ACT nasal spray Place 2 sprays into both nostrils daily. 16 g 12   lamoTRIgine (LAMICTAL) 150 MG tablet Take 150 mg by mouth at bedtime.     levocetirizine (XYZAL ) 5 MG tablet Take 1 tablet (5 mg total) by mouth daily. 30 tablet 2   levonorgestrel  (MIRENA ) 20 MCG/DAY IUD 1 each by Intrauterine route once.     methylphenidate  36 MG PO CR tablet Take 1 tablet (36 mg total) by mouth daily with breakfast. 30 tablet 0   Semaglutide -Weight Management (WEGOVY ) 1 MG/0.5ML SOAJ Inject 1 mg into the skin once a week. 2 mL 2   traZODone (DESYREL) 50 MG tablet Take 50-100 mg by mouth at bedtime as needed.     VRAYLAR 1.5 MG capsule Take 1.5 mg by mouth daily.     ondansetron  (ZOFRAN -ODT) 4 MG disintegrating tablet Take 1 tablet (4 mg total) by mouth every 8 (eight) hours as needed for nausea. (Patient not taking: Reported on 05/14/2024) 10 tablet 0   No current facility-administered medications on file prior to visit.     Observations/Objective:   Vitals:   05/14/24 1139  Height: 5\' 2"  (1.575 m)   GEN:  The patient appears stated age and is in NAD. Neurological examination: Patient is awake, alert. No aphasia or dysarthria. Intact fluency and comprehension. Cranial nerves: Extraocular movements intact. No facial asymmetry. Motor: moves all extremities symmetrically, at least anti-gravity x 4. Gait: narrow-based and steady.   Assessment and Plan:   This is a pleasant 18 yo LH woman with a history of ADHD who presented for dizziness and headaches. Description of dizziness suggestive of near syncope where she describes feeling like she would pass out, seeing stars, nausea, tinnitus. BP runs low, she had a 10-point drop from supine to standing on initial visit. MRI brain normal. She is undergoing Cardiac workup. ENT raised concern for vestibular  migraines, she is reporting more head pains and continued dizziness. We discussed a trial of a different headache preventative medication, Gabapentin, side effects discussed. Start low dose Gabapentin 100mg  at bedtime, she will update our office in a month, we may uptitrate as tolerated. Keep a calendar of symptoms as we start medication, follow-up in 3-4 months, call for any changes.    Follow Up Instructions:   -I discussed the assessment and treatment plan with the patient. The patient was provided an opportunity to ask questions and all were answered. The patient agreed with the plan and demonstrated an understanding of the instructions.   The patient was advised to call back or seek an in-person evaluation if the symptoms worsen or if the condition fails to improve as anticipated.     Jhonny Moss, MD

## 2024-05-14 NOTE — Patient Instructions (Signed)
 Good to see you!  Start Gabapentin 100mg : take 1 capsule every  night. Update me in a month on how you are feeling, we may increase dose if needed.  2. Keep a calendar of your symptoms  3. Proceed with Cardiology testing  4. Follow-up in 3-4 months, call for any changes

## 2024-05-16 ENCOUNTER — Ambulatory Visit (HOSPITAL_COMMUNITY)
Admission: RE | Admit: 2024-05-16 | Discharge: 2024-05-16 | Disposition: A | Discharge status: Home / Self Care | Source: Ambulatory Visit | Attending: Cardiology | Admitting: Cardiology

## 2024-05-16 DIAGNOSIS — R42 Dizziness and giddiness: Secondary | ICD-10-CM

## 2024-05-16 LAB — ECHOCARDIOGRAM COMPLETE: S' Lateral: 2.5 cm

## 2024-05-18 DIAGNOSIS — F900 Attention-deficit hyperactivity disorder, predominantly inattentive type: Secondary | ICD-10-CM | POA: Diagnosis not present

## 2024-05-18 DIAGNOSIS — F4312 Post-traumatic stress disorder, chronic: Secondary | ICD-10-CM | POA: Diagnosis not present

## 2024-05-18 DIAGNOSIS — F314 Bipolar disorder, current episode depressed, severe, without psychotic features: Secondary | ICD-10-CM | POA: Diagnosis not present

## 2024-05-18 DIAGNOSIS — F411 Generalized anxiety disorder: Secondary | ICD-10-CM | POA: Diagnosis not present

## 2024-05-28 NOTE — Telephone Encounter (Addendum)
**Note De-Identified Costantino Kohlbeck Obfuscation** I called [employer] and s/w Jasmine who advised me that a Home Sleep Study Type 3-G0399 is not covered under the pts plan but that they will cover a Itamar-HST-CPT Code: 16109. Call Reference #: [id number]  Forwarding this note to Dr Lorie Rook and his nurse for advisement to the pt.

## 2024-05-28 NOTE — Telephone Encounter (Signed)
 Order placed for Itamar-HST-CPT Code: 16109.

## 2024-05-29 NOTE — Telephone Encounter (Signed)
**Note De-Identified Mohan Erven Obfuscation** I called the pt but got no answer so I left a message on her VM asking her to call Tanis Fan back at Dr Arthuro Billow office at [phone].

## 2024-05-29 NOTE — Telephone Encounter (Signed)
**Note De-Identified Jenna Santos Obfuscation** Ordering provider: Dr Lorie Rook Associated diagnoses: Snoring-R06.83  WatchPAT PA obtained on 05/29/2024 by Hessie Varone, Isabella Mao, LPN. Authorization: Per Jennings Mohr at [employer] [facility], a Itamar-HST-CPT Code: 16109 does not require a PA.Aaron Aas   Patient notified of PIN ([id number]) on 05/29/2024 Gopal Malter Notification Method: phone. The pt states that she will come by the office to pick up a WatchPAT One-HST device and to get instructions on how to use. She is aware that it is ok for her to come by the office between 8 am-12 pm on Monday and between 8 am and 5 pm on Tuesday through Friday.  Phone note routed to covering staff for follow-up.

## 2024-05-30 DIAGNOSIS — F411 Generalized anxiety disorder: Secondary | ICD-10-CM | POA: Diagnosis not present

## 2024-06-02 ENCOUNTER — Encounter (HOSPITAL_COMMUNITY): Payer: Self-pay

## 2024-06-02 ENCOUNTER — Emergency Department (HOSPITAL_COMMUNITY)
Admission: EM | Admit: 2024-06-02 | Discharge: 2024-06-02 | Disposition: A | Discharge status: Home / Self Care | Attending: Emergency Medicine | Admitting: Emergency Medicine

## 2024-06-02 ENCOUNTER — Emergency Department (HOSPITAL_COMMUNITY)

## 2024-06-02 ENCOUNTER — Other Ambulatory Visit: Payer: Self-pay

## 2024-06-02 DIAGNOSIS — N760 Acute vaginitis: Secondary | ICD-10-CM | POA: Diagnosis not present

## 2024-06-02 DIAGNOSIS — R102 Pelvic and perineal pain: Secondary | ICD-10-CM | POA: Diagnosis not present

## 2024-06-02 DIAGNOSIS — B9689 Other specified bacterial agents as the cause of diseases classified elsewhere: Secondary | ICD-10-CM

## 2024-06-02 LAB — CBC
HCT: 44.7 % (ref 36.0–46.0)
Hemoglobin: 13.4 g/dL (ref 12.0–15.0)
MCH: 23.1 pg — ABNORMAL LOW (ref 26.0–34.0)
MCHC: 30 g/dL (ref 30.0–36.0)
MCV: 76.9 fL — ABNORMAL LOW (ref 80.0–100.0)
Platelets: 317 10*3/uL (ref 150–400)
RBC: 5.81 MIL/uL — ABNORMAL HIGH (ref 3.87–5.11)
RDW: 15 % (ref 11.5–15.5)
WBC: 10.5 10*3/uL (ref 4.0–10.5)
nRBC: 0 % (ref 0.0–0.2)

## 2024-06-02 LAB — COMPREHENSIVE METABOLIC PANEL WITH GFR
ALT: 9 U/L (ref 0–44)
AST: 17 U/L (ref 15–41)
Albumin: 4.3 g/dL (ref 3.5–5.0)
Alkaline Phosphatase: 73 U/L (ref 38–126)
Anion gap: 11 (ref 5–15)
BUN: 13 mg/dL (ref 6–20)
CO2: 25 mmol/L (ref 22–32)
Calcium: 9.7 mg/dL (ref 8.9–10.3)
Chloride: 101 mmol/L (ref 98–111)
Creatinine, Ser: 1.21 mg/dL — ABNORMAL HIGH (ref 0.44–1.00)
GFR, Estimated: >60 mL/min (ref >60)
Glucose, Bld: 94 mg/dL (ref 70–99)
Potassium: 3.7 mmol/L (ref 3.5–5.1)
Sodium: 137 mmol/L (ref 135–145)
Total Bilirubin: 0.6 mg/dL (ref 0.0–1.2)
Total Protein: 7.8 g/dL (ref 6.5–8.1)

## 2024-06-02 LAB — URINALYSIS, ROUTINE W REFLEX MICROSCOPIC
Bilirubin Urine: NEGATIVE
Glucose, UA: NEGATIVE mg/dL
Ketones, ur: NEGATIVE mg/dL
Leukocytes,Ua: NEGATIVE
Nitrite: NEGATIVE
Protein, ur: 30 mg/dL — AB
Specific Gravity, Urine: 1.028 (ref 1.005–1.030)
pH: 5 (ref 5.0–8.0)

## 2024-06-02 LAB — WET PREP, GENITAL
Sperm: NONE SEEN
Trich, Wet Prep: NONE SEEN
WBC, Wet Prep HPF POC: >=10 — AB (ref <10)
Yeast Wet Prep HPF POC: NONE SEEN

## 2024-06-02 LAB — LIPASE, BLOOD: Lipase: 33 U/L (ref 11–51)

## 2024-06-02 LAB — HCG, SERUM, QUALITATIVE: Preg, Serum: NEGATIVE

## 2024-06-02 MED ORDER — METRONIDAZOLE 0.75 % VA GEL: 1.0000 | Freq: Two times a day (BID) | VAGINAL | 0 refills | Status: DC

## 2024-06-02 NOTE — ED Triage Notes (Signed)
 Pt is coming in for pelvic paint that started 3.5 weeks ago, she mentions that she had a lighter than cycle 3.5 weeks ago and since there she has had intermittent pelvic pain/cramping with uterine shedding with no bleeding. She does not have any bleeding with this at this time. She is stable at this time with no other complaints.

## 2024-06-02 NOTE — ED Provider Notes (Signed)
 Jenna Santos Provider Note   CSN: [id number] Arrival date & time: 06/02/24  9966     Patient presents with: Pelvic Pain   Jenna Santos is a 18 y.o. female.  Patient presents the emergency room complaining of pelvic cramping.  Patient states that she had a light menstrual cycle approximate 3 and half weeks ago and has had intermittent cramping since that time with persistent uterine shedding.  She denies persistent uterine bleeding.  She says that she has no bleeding at this time but that she googled uterine shedding and feels that what she has seen on tissue is consistent with this.  She denies other abdominal pain, nausea, vomiting, chest pain, shortness of breath, fever, urinary symptoms.  Past medical history significant for dysmenorrhea    Pelvic Pain       Prior to Admission medications   Medication Sig Start Date End Date Taking? Authorizing Provider  albuterol  (VENTOLIN  HFA) 108 (90 Base) MCG/ACT inhaler Inhale 1-2 puffs into the lungs every 6 (six) hours as needed for wheezing or shortness of breath. 03/29/24   Gladis Elsie BROCKS, PA-C  Carbinoxamine  Maleate ER 4 MG/5ML SUER Take 10 mLs by mouth at bedtime as needed. 04/05/24   Klett, Macario HERO, NP  EPINEPHrine  (EPIPEN  2-PAK) 0.3 mg/0.3 mL IJ SOAJ injection Inject 0.3 mg into the muscle as needed for anaphylaxis. 09/12/23   Belenda Macario HERO, NP  fluticasone  (FLONASE ) 50 MCG/ACT nasal spray Place 2 sprays into both nostrils daily. 03/21/24 05/14/24  Rothstein, Chloe E, NP  gabapentin  (NEURONTIN ) 100 MG tablet Take 1 tablet every night 05/14/24   Georjean Darice HERO, MD  lamoTRIgine (LAMICTAL) 150 MG tablet Take 150 mg by mouth at bedtime. 01/06/24   [provider]  levocetirizine (XYZAL ) 5 MG tablet Take 1 tablet (5 mg total) by mouth daily. 04/05/24 05/14/24  Belenda Macario HERO, NP  levonorgestrel  (MIRENA ) 20 MCG/DAY IUD 1 each by Intrauterine route once.    [provider]   methylphenidate  36 MG PO CR tablet Take 1 tablet (36 mg total) by mouth daily with breakfast. 02/21/24   Joshua Bari HERO, NP  ondansetron  (ZOFRAN -ODT) 4 MG disintegrating tablet Take 1 tablet (4 mg total) by mouth every 8 (eight) hours as needed for nausea. Patient not taking: Reported on 05/14/2024 02/08/24   Hoy Nidia FALCON, PA-C  Semaglutide -Weight Management (WEGOVY ) 1 MG/0.5ML SOAJ Inject 1 mg into the skin once a week. 05/02/24   Motwani, Komal, MD  traZODone (DESYREL) 50 MG tablet Take 50-100 mg by mouth at bedtime as needed. 04/12/24   [provider]  VRAYLAR 1.5 MG capsule Take 1.5 mg by mouth daily. 04/20/24   [provider]    Allergies: Banana    Review of Systems  Genitourinary:  Positive for pelvic pain.    Updated Vital Signs BP 117/73   Pulse 97   Temp 98.8 F (37.1 C) (Oral)   Resp 20   SpO2 99%   Physical Exam Vitals and nursing note reviewed. Exam conducted with a chaperone present.  Constitutional:      General: She is not in acute distress.    Appearance: She is well-developed.  HENT:     Head: Normocephalic and atraumatic.   Eyes:     Conjunctiva/sclera: Conjunctivae normal.    Cardiovascular:     Rate and Rhythm: Normal rate and regular rhythm.  Pulmonary:     Effort: Pulmonary effort is normal. No respiratory distress.  Breath sounds: Normal breath sounds.  Abdominal:     Palpations: Abdomen is soft.     Tenderness: There is no abdominal tenderness.  Genitourinary:    Comments: No uterine material appreciated on pelvic exam.  There was a small amount of whitish-yellow discharge.  No bleeding appreciated  Musculoskeletal:        General: No swelling.     Cervical back: Neck supple.   Skin:    General: Skin is warm and dry.     Capillary Refill: Capillary refill takes less than 2 seconds.   Neurological:     Mental Status: She is alert.   Psychiatric:        Mood and Affect: Mood normal.     (all labs ordered  are listed, but only abnormal results are displayed) Labs Reviewed  COMPREHENSIVE METABOLIC PANEL WITH GFR - Abnormal; Notable for the following components:      Result Value   Creatinine, Ser 1.21 (*)    All other components within normal limits  CBC - Abnormal; Notable for the following components:   RBC 5.81 (*)    MCV 76.9 (*)    MCH 23.1 (*)    All other components within normal limits  WET PREP, GENITAL  LIPASE, BLOOD  HCG, SERUM, QUALITATIVE  URINALYSIS, ROUTINE W REFLEX MICROSCOPIC  GC/CHLAMYDIA PROBE AMP ([place]) NOT AT [facility]    EKG: None  Radiology: No results found.   Procedures   Medications Ordered in the ED - No data to display                                  Medical Decision Making Amount and/or Complexity of Data Reviewed Labs: ordered. Radiology: ordered.   This patient presents to the ED for concern of pelvic discomfort, this involves an extensive number of treatment options, and is a complaint that carries with it a high risk of complications and morbidity.  The differential diagnosis includes dysmenorrhea, ectopic pregnancy, ovarian torsion, others   Co morbidities / Chronic conditions that complicate the patient evaluation  History of dysmenorrhea   Additional history obtained:  Additional history obtained from EMR   Lab Tests:  I Ordered, and personally interpreted labs.  The pertinent results include: Grossly unremarkable CBC, mildly elevated creatinine at 1.21, negative pregnancy test   Imaging Studies ordered:  I ordered imaging studies including pelvic ultrasound    Social Determinants of Health:  Patient has Medicaid for her primary health insurance type   Test / Admission - Considered:  No uterine shedding noted as described by patient on physical exam.  She did have some discharge.  Wet prep and GC chlamydia testing have been sent to the lab.  Pelvic ultrasound results pending.  Patient will likely need follow-up  with GYN for further evaluation as an outpatient.      Final diagnoses:  Pelvic cramping    ED Discharge Orders     None          Jenna Santos 06/02/24 [id number]    Trine Raynell Moder, MD 06/03/24 [phone]

## 2024-06-02 NOTE — ED Provider Notes (Signed)
  Physical Exam  BP 117/73   Pulse 97   Temp 98.8 F (37.1 C) (Oral)   Resp 20   SpO2 99%   Physical Exam  Procedures  Procedures  ED Course / MDM    Medical Decision Making Amount and/or Complexity of Data Reviewed Labs: ordered. Radiology: ordered.   Pelvic pain x 1 month No bleeding Pending US  and wet prep  US  result per radiologist: IMPRESSION: 1. No acute findings.  No signs of ovarian torsion. 2. IUD in place within the endometrial cavity.  Wet prep showing BV. Will treat with Flagyl, Metrogel. She is established with GYN and will follow up in office.        Odell Balls, PA-C 06/02/24 [id number]    Trine Raynell Moder, MD 06/03/24 [phone]

## 2024-06-02 NOTE — Discharge Instructions (Signed)
 As we discussed, follow up with your gynecologist for further in-office care. Use the Metrogel as instructed.   Return to the emergency department with any new or concerning symptoms.

## 2024-06-04 LAB — GC/CHLAMYDIA PROBE AMP (~~LOC~~) NOT AT ARMC
Chlamydia: NEGATIVE
Comment: NEGATIVE
Comment: NORMAL
Neisseria Gonorrhea: NEGATIVE

## 2024-06-06 DIAGNOSIS — F411 Generalized anxiety disorder: Secondary | ICD-10-CM | POA: Diagnosis not present

## 2024-06-06 NOTE — Telephone Encounter (Signed)
 Patient agreement reviewed and signed on 06/06/2024.  WatchPAT issued to patient on 06/06/2024 by Woodie LOISE Prudent. Patient aware of pin number Patient profile initialized in CloudPAT on 06/06/2024 by Rockie RAMAN. Device serial number: [id number]  Please list Reason for Call as Advice Only and type WatchPAT issued to patient in the comment box.

## 2024-06-11 DIAGNOSIS — F900 Attention-deficit hyperactivity disorder, predominantly inattentive type: Secondary | ICD-10-CM | POA: Diagnosis not present

## 2024-06-11 DIAGNOSIS — F4312 Post-traumatic stress disorder, chronic: Secondary | ICD-10-CM | POA: Diagnosis not present

## 2024-06-11 DIAGNOSIS — F411 Generalized anxiety disorder: Secondary | ICD-10-CM | POA: Diagnosis not present

## 2024-06-11 DIAGNOSIS — F314 Bipolar disorder, current episode depressed, severe, without psychotic features: Secondary | ICD-10-CM | POA: Diagnosis not present

## 2024-06-13 DIAGNOSIS — F411 Generalized anxiety disorder: Secondary | ICD-10-CM | POA: Diagnosis not present

## 2024-06-18 NOTE — Telephone Encounter (Signed)
 Pt returned my call.  She completed the sleep test last Wed 06/13/24.

## 2024-06-18 NOTE — Telephone Encounter (Signed)
 Left message for pt to call back

## 2024-06-20 DIAGNOSIS — F411 Generalized anxiety disorder: Secondary | ICD-10-CM | POA: Diagnosis not present

## 2024-06-25 ENCOUNTER — Telehealth: Payer: Self-pay

## 2024-06-25 DIAGNOSIS — R42 Dizziness and giddiness: Secondary | ICD-10-CM

## 2024-06-25 DIAGNOSIS — Z6841 Body Mass Index (BMI) 40.0 and over, adult: Secondary | ICD-10-CM

## 2024-06-25 DIAGNOSIS — R0683 Snoring: Secondary | ICD-10-CM

## 2024-06-25 NOTE — Telephone Encounter (Signed)
**Note De-Identified Chaniqua Brisby Obfuscation** Ordering provider: Dr Shlomo Associated diagnoses: Snoring-R06.83 and Somnolence-R40.0  WatchPAT PA obtained on 06/25/2024 by Teniqua Marron, Avelina HERO, LPN. Authorization: Per Gerlene DEL at [facility] MEDICAID [employer]ALTH[employer] a PA is not required for this [place]-HST PA. Call Reference #: [id number]  Patient notified of PIN (1234) on 06/25/2024 Neima Lacross Notification Method: phone. I called the pt but got no answer so I left a message on her VM (Ok per [facility]) advising her that per Dr Shlomo the last WatchPAT One-HST that she did was a nondiagnostic study due to very poor PAT signal with no data.  I also advised in the message that I have s/w [employer] and that they have approved for her to do another WatchPAT one-HST and that she can come by the office anytime between 8-4 M-F to pick up the device w/instructions and that the Pin # is the same as her last which is [id number]. I did leave my name and the office phone number so she can call back if she has any questions or concerns.   Phone note routed to covering staff for follow-up.

## 2024-06-25 NOTE — Telephone Encounter (Signed)
**Note De-identified Firas Guardado Obfuscation** -----  **Note De-Identified Dimple Bastyr Obfuscation** Message from Wilbert Bihari sent at 06/24/2024  5:43 PM EDT ----- Nondiagnostic study due to very poor PAT signal with no data.  Patient needs to repeat home sleep study

## 2024-06-26 DIAGNOSIS — F411 Generalized anxiety disorder: Secondary | ICD-10-CM | POA: Diagnosis not present

## 2024-07-03 ENCOUNTER — Ambulatory Visit: Admitting: Obstetrics and Gynecology

## 2024-07-09 DIAGNOSIS — F900 Attention-deficit hyperactivity disorder, predominantly inattentive type: Secondary | ICD-10-CM | POA: Diagnosis not present

## 2024-07-09 DIAGNOSIS — F4312 Post-traumatic stress disorder, chronic: Secondary | ICD-10-CM | POA: Diagnosis not present

## 2024-07-09 DIAGNOSIS — F411 Generalized anxiety disorder: Secondary | ICD-10-CM | POA: Diagnosis not present

## 2024-07-09 DIAGNOSIS — F314 Bipolar disorder, current episode depressed, severe, without psychotic features: Secondary | ICD-10-CM | POA: Diagnosis not present

## 2024-07-11 ENCOUNTER — Encounter: Payer: Self-pay | Admitting: "Endocrinology

## 2024-07-11 DIAGNOSIS — F411 Generalized anxiety disorder: Secondary | ICD-10-CM | POA: Diagnosis not present

## 2024-07-12 ENCOUNTER — Telehealth (INDEPENDENT_AMBULATORY_CARE_PROVIDER_SITE_OTHER): Admitting: "Endocrinology

## 2024-07-12 ENCOUNTER — Encounter: Payer: Self-pay | Admitting: "Endocrinology

## 2024-07-12 VITALS — Ht 62.4 in | Wt 212.0 lb

## 2024-07-12 DIAGNOSIS — R7989 Other specified abnormal findings of blood chemistry: Secondary | ICD-10-CM | POA: Diagnosis not present

## 2024-07-12 DIAGNOSIS — Z6838 Body mass index (BMI) 38.0-38.9, adult: Secondary | ICD-10-CM | POA: Diagnosis not present

## 2024-07-12 DIAGNOSIS — R7303 Prediabetes: Secondary | ICD-10-CM

## 2024-07-12 DIAGNOSIS — E66812 Obesity, class 2: Secondary | ICD-10-CM

## 2024-07-12 DIAGNOSIS — E88819 Insulin resistance, unspecified: Secondary | ICD-10-CM | POA: Diagnosis not present

## 2024-07-12 DIAGNOSIS — E282 Polycystic ovarian syndrome: Secondary | ICD-10-CM

## 2024-07-12 MED ORDER — WEGOVY 1.7 MG/0.75ML ~~LOC~~ SOAJ: 1.7000 mg | SUBCUTANEOUS | 0 refills | Status: DC

## 2024-07-12 NOTE — Progress Notes (Signed)
 The patient reports they are currently: Jenna Santos. I spent 10-11  minutes on the video with the patient on the date of service. I spent an additional 5 minutes on pre- and post-visit activities on the date of service.   The patient was physically located in [place]  or a state in which I am permitted to provide care. The patient and/or parent/guardian understood that s/he may incur co-pays and cost sharing, and agreed to the telemedicine visit. The visit was reasonable and appropriate under the circumstances given the patient's presentation at the time.  The patient and/or parent/guardian has been advised of the potential risks and limitations of this mode of treatment (including, but not limited to, the absence of in-person examination) and has agreed to be treated using telemedicine. The patient's/patient's family's questions regarding telemedicine have been answered.   The patient and/or parent/guardian has also been advised to contact their provider's office for worsening conditions, and seek emergency medical treatment and/or call 911 if the patient deems either necessary.     Outpatient Endocrinology Note [name], MD    [facility] 01-26-2006 [id number]  Referring Provider: Belenda Macario HERO, NP Primary Care Provider: Belenda Macario HERO, NP Reason for consultation: Subjective   Assessment & Plan  Diagnoses and all orders for this visit:  Elevated morning serum cortisol level -     Cortisol -     ACTH  PCOS (polycystic ovarian syndrome)  Insulin  resistance -     Cortisol -     ACTH  Pre-diabetes -     Cortisol -     ACTH -     Hemoglobin A1c  Class 2 severe obesity due to excess calories with serious comorbidity and body mass index (BMI) of 38.0 to 38.9 in adult [facility])  Other orders -     Semaglutide -Weight Management (WEGOVY ) 1.7 MG/0.75ML SOAJ; Inject 1.7 mg into the skin once a week.    Return in about 29 days (around 08/10/2024) for visit and 8 am labs  before next visit.  PCOS with morbid obesity, complicated by prediabetes, insulin  resistance, elevated serum cortisol  Not able to tolerate regular metformin  500 mg every day due to nausea and feeling drained 2025: Not taking metformin  XR 500mg  two pills for 2 weeks -felt super sick due to nausea  No history of MEN syndrome/medullary thyroid  cancer/pancreatitis or pancreatic cancer in self or family Pt interested in weight loss Discussed lifestyle changes, medical management as well as bariatric surgery previously  Maintain healthy lifestyle including 1200 Cal/day, 30 min of activity/day, avoiding refined/processed/outside food 20 minutes physical activity per day, in continuum or interruptedly through the day  Goals: less than 60 grams of carbohydrate/meal, 1200-1500 Cal/day, 10,0000 steps a day and weight loss of 0.5-1 lb/ wk  Sleep 7-9 hours/day, adapt good sleep hygiene Adapt de-stressing and relaxation techniques to prevent stress induced weight gain Avoid/switch medications that lead to weight gain by discussing with the prescribing physician   Trulicity  not covered unless fails metformin  Was 226 lbs before starting it  On wegovy  1 mg/week - has no side effects 07/12/24: Increase to Wegovy  1.7mg /week  Elevated serum cortisol level in the morning Ordered 1 mg dexamethasone  suppression test again, patient did not do it previously  No history of adrenal adenoma  PCOS based off of history of irregular menstrual cycle, currently on IUD managed by OB/GYN Patient has mild hirsutism  I have reviewed current medications, nurse's notes, allergies, vital signs, past medical and surgical history, family medical  history, and social history for this encounter. Counseled patient on symptoms, examination findings, lab findings, imaging results, treatment decisions and monitoring and prognosis. The patient understood the recommendations and agrees with the treatment plan. All questions regarding  treatment plan were fully answered.  Return in about 29 days (around 08/10/2024) for visit and 8 am labs before next visit.   [name], MD  07/12/24   History of Present Illness HPI  Jenna Santos is a 18 y.o. year old female who presents for follow-up of prediabetes/insulin  resistance/grade 2 obesity/elevated cortisol.  Patient is currently taking Wegovy  1 mg/week Denies any side effects except when eats a heavy meal gets GI side effects Patient is interested in increasing the metformin  to the next dose Based off of her weight, patient has lost 14 pounds since on Wegovy  Did not do 1 mg dexamethasone  suppression test, reports nobody called her  Initial history:  Saw an endocrinologist in past due to high A1C Limits sugars, always walking, 3-5 mi/day Never been on steroids  Started weight gain during middle school slowly over time and then again during Covid time due to personal stress   Tried metformin  500 mg every day, stopped due to nausea and feeling drained  On wegovy  0.5 mg/week - has no side effects 2025: Not taking metformin  XR 500mg  two pills for 2 weeks -felt super sick due to nausea  Patient questions if she has PCOS.  She meets 2 out of 3 NIH Criteria for PCOS: Irregular menstrual cycles: history of both Oligomenorrhea (infrequent periods) or anovulation (absence of periods). Has IUD now. Clinical or biochemical hyperandrogenism: acne and hirsutism (excessive hair growth) Polycystic ovaries: no ultrasound imaging  Physical Exam  Ht 5' 2.4 (1.585 m)   Wt 212 lb (96.2 kg)   BMI 38.28 kg/m    Constitutional: well developed, well nourished Head: normocephalic, atraumatic Eyes: sclera anicteric, no redness Neck: supple Lungs: normal respiratory effort Neurology: alert and oriented Skin: dry, no appreciable rashes Musculoskeletal: no appreciable defects Psychiatric: normal mood and affect   Current Medications Patient's Medications  New  Prescriptions   SEMAGLUTIDE -WEIGHT MANAGEMENT (WEGOVY ) 1.7 MG/0.75ML SOAJ    Inject 1.7 mg into the skin once a week.  Previous Medications   ALBUTEROL  (VENTOLIN  HFA) 108 (90 BASE) MCG/ACT INHALER    Inhale 1-2 puffs into the lungs every 6 (six) hours as needed for wheezing or shortness of breath.   CARBINOXAMINE  MALEATE ER 4 MG/5ML SUER    Take 10 mLs by mouth at bedtime as needed.   EPINEPHRINE  (EPIPEN  2-PAK) 0.3 MG/0.3 ML IJ SOAJ INJECTION    Inject 0.3 mg into the muscle as needed for anaphylaxis.   FLUTICASONE  (FLONASE ) 50 MCG/ACT NASAL SPRAY    Place 2 sprays into both nostrils daily.   GABAPENTIN  (NEURONTIN ) 100 MG TABLET    Take 1 tablet every night   LAMOTRIGINE (LAMICTAL) 150 MG TABLET    Take 150 mg by mouth at bedtime.   LEVOCETIRIZINE (XYZAL ) 5 MG TABLET    Take 1 tablet (5 mg total) by mouth daily.   LEVONORGESTREL  (MIRENA ) 20 MCG/DAY IUD    1 each by Intrauterine route once.   METHYLPHENIDATE  36 MG PO CR TABLET    Take 1 tablet (36 mg total) by mouth daily with breakfast.   METRONIDAZOLE  (METROGEL ) 0.75 % VAGINAL GEL    Place 1 Applicatorful vaginally 2 (two) times daily.   ONDANSETRON  (ZOFRAN -ODT) 4 MG DISINTEGRATING TABLET    Take 1 tablet (4 mg total)  by mouth every 8 (eight) hours as needed for nausea.   TRAZODONE (DESYREL) 50 MG TABLET    Take 50-100 mg by mouth at bedtime as needed.   VRAYLAR 1.5 MG CAPSULE    Take 1.5 mg by mouth daily.  Modified Medications   No medications on file  Discontinued Medications   SEMAGLUTIDE -WEIGHT MANAGEMENT (WEGOVY ) 1 MG/0.5ML SOAJ    Inject 1 mg into the skin once a week.    Allergies Allergies  Allergen Reactions   Banana Anaphylaxis    Past Medical History Past Medical History:  Diagnosis Date   Acanthosis nigricans 10/17/2018   Amenorrhea 03/31/2020   Attention deficit hyperactivity disorder (ADHD) 07/22/2022   Dysmenorrhea 03/15/2022   Elevated hemoglobin A1c 11/07/2018   Obesity    Tinnitus of both ears 09/12/2023     Past Surgical History Past Surgical History:  Procedure Laterality Date   WISDOM TOOTH EXTRACTION Bilateral 2024    Family History family history includes Arthritis in her maternal grandmother; Cancer in her maternal grandfather; Diabetes in her maternal grandfather; Hyperlipidemia in her maternal grandmother; Hypertension in her maternal grandmother; Stroke in her maternal grandmother.  Social History Social History   Socioeconomic History   Marital status: Single    Spouse name: Not on file   Number of children: Not on file   Years of education: Not on file   Highest education level: Not on file  Occupational History   Not on file  Tobacco Use   Smoking status: Never    Passive exposure: Never   Smokeless tobacco: Never  Vaping Use   Vaping status: Never Used  Substance and Sexual Activity   Alcohol use: No   Drug use: No   Sexual activity: Yes    Birth control/protection: Condom, I.U.D.  Other Topics Concern   Not on file  Social History Narrative   Lives at home with mom   Father not involved   12th grade at [employer]   Left handed          Social Drivers of Health   Financial Resource Strain: Not on file  Food Insecurity: Not on file  Transportation Needs: Not on file  Physical Activity: Not on file  Stress: Not on file  Social Connections: Not on file  Intimate Partner Violence: Not on file    Lab Results  Component Value Date   CHOL 150 12/21/2023   Lab Results  Component Value Date   HDL 63 12/21/2023   Lab Results  Component Value Date   LDLCALC 76 12/21/2023   Lab Results  Component Value Date   TRIG 37 12/21/2023   Lab Results  Component Value Date   CHOLHDL 2.4 12/21/2023   Lab Results  Component Value Date   CREATININE 1.21 (H) 06/02/2024   No results found for: GFR    Component Value Date/Time   NA 137 06/02/2024 0058   K 3.7 06/02/2024 0058   CL 101 06/02/2024 0058   CO2 25 06/02/2024 0058    GLUCOSE 94 06/02/2024 0058   BUN 13 06/02/2024 0058   CREATININE 1.21 (H) 06/02/2024 0058   CREATININE 0.85 12/21/2023 0920   CALCIUM 9.7 06/02/2024 0058   PROT 7.8 06/02/2024 0058   ALBUMIN 4.3 06/02/2024 0058   AST 17 06/02/2024 0058   ALT 9 06/02/2024 0058   ALKPHOS 73 06/02/2024 0058   BILITOT 0.6 06/02/2024 0058   GFRNONAA >60 06/02/2024 0058      Latest Ref Rng &  Units 06/02/2024   12:58 AM 02/08/2024    3:32 PM 02/08/2024    1:29 AM  BMP  Glucose 70 - 99 mg/dL 94  68  84   BUN 6 - 20 mg/dL 13  20  15    Creatinine 0.44 - 1.00 mg/dL 8.78  9.03  8.92   Sodium 135 - 145 mmol/L 137  140  140   Potassium 3.5 - 5.1 mmol/L 3.7  4.9  4.3   Chloride 98 - 111 mmol/L 101  104  105   CO2 22 - 32 mmol/L 25  23  24    Calcium 8.9 - 10.3 mg/dL 9.7  89.9  9.8        Component Value Date/Time   WBC 10.5 06/02/2024 0058   RBC 5.81 (H) 06/02/2024 0058   HGB 13.4 06/02/2024 0058   HCT 44.7 06/02/2024 0058   PLT 317 06/02/2024 0058   MCV 76.9 (L) 06/02/2024 0058   MCH 23.1 (L) 06/02/2024 0058   MCHC 30.0 06/02/2024 0058   RDW 15.0 06/02/2024 0058   LYMPHSABS 1.9 02/08/2024 1532   MONOABS 0.7 02/08/2024 1532   EOSABS 0.0 02/08/2024 1532   BASOSABS 0.0 02/08/2024 1532   Lab Results  Component Value Date   TSH 2.08 12/21/2023   TSH 1.54 10/17/2023   TSH 1.23 10/17/2018   FREET4 1.2 12/21/2023   FREET4 1.1 10/17/2023   FREET4 1.1 10/17/2018         Parts of this note may have been dictated using voice recognition software. There may be variances in spelling and vocabulary which are unintentional. Not all errors are proofread. Please notify the dino if any discrepancies are noted or if the meaning of any statement is not clear.

## 2024-07-12 NOTE — Patient Instructions (Signed)
Maintain healthy lifestyle including 1200 Cal/day, 30 min of activity/day, avoiding refined/processed/outside food 20 minutes physical activity per day, in continuum or interruptedly through the day  Goals: less than 60 grams of carbohydrate/meal, 1200-1500 Cal/day, 10,0000 steps a day and weight loss of 0.5-1 lb/ wk  Sleep 7-9 hours/day, adapt good sleep hygiene Adapt de-stressing and relaxation techniques to prevent stress induced weight gain Avoid/switch medications that lead to weight gain by discussing with the prescribing physician

## 2024-07-18 ENCOUNTER — Ambulatory Visit: Admitting: Obstetrics and Gynecology

## 2024-08-02 ENCOUNTER — Ambulatory Visit: Admitting: Family Medicine

## 2024-08-07 DIAGNOSIS — F902 Attention-deficit hyperactivity disorder, combined type: Secondary | ICD-10-CM | POA: Diagnosis not present

## 2024-08-07 DIAGNOSIS — F422 Mixed obsessional thoughts and acts: Secondary | ICD-10-CM | POA: Diagnosis not present

## 2024-08-07 DIAGNOSIS — F3181 Bipolar II disorder: Secondary | ICD-10-CM | POA: Diagnosis not present

## 2024-08-09 ENCOUNTER — Ambulatory Visit: Payer: Medicaid Other | Admitting: Dermatology

## 2024-08-09 ENCOUNTER — Encounter: Payer: Self-pay | Admitting: Dermatology

## 2024-08-09 VITALS — BP 101/74

## 2024-08-09 DIAGNOSIS — L409 Psoriasis, unspecified: Secondary | ICD-10-CM | POA: Diagnosis not present

## 2024-08-09 MED ORDER — FLUOCINOLONE ACETONIDE SCALP 0.01 % EX OIL: 1.0000 "application " | TOPICAL_OIL | CUTANEOUS | 2 refills | Status: DC

## 2024-08-09 MED ORDER — CLOBETASOL PROPIONATE 0.05 % EX SOLN: 1.0000 | Freq: Every day | CUTANEOUS | 3 refills | Status: DC

## 2024-08-09 NOTE — Patient Instructions (Addendum)
 Date: Thu Aug 09 2024  Jenna Santos,  Thank you for visiting today. Here is a summary of the key instructions:  - Medications:   - Use DHS-Zinc shampoo once a week to every 2 weeks   - Apply clobetasol  liquid once daily on affected areas, twice daily on trouble spots   - Use Dermasmooth oil on scalp for a couple of hours before shampooing  - Skin Care:   - Apply to scalp and let sit for 3 minutes when using DHS-Zinc shampoo   - Rinse and follow with regular shampoo   - Dermasmooth oil helps soften flakes  - Follow-up:   - Return for follow-up appointment in 8 to 12 weeks   - Bring lab results to next appointment if already done  - Other Instructions:   - Get TB and hepatitis screening tests at any lab   - Purchase DHS-Zinc shampoo (available on Amazon)   - Pick up clobetasol  liquid and Dermasmooth oil prescriptions from your pharmacy  Please reach out if you have any questions or concerns.  Warm regards,  Dr. Delon [facility]        Important Information  Due to recent changes in healthcare laws, you may see results of your pathology and/or laboratory studies on MyChart before the doctors have had a chance to review them. We understand that in some cases there may be results that are confusing or concerning to you. Please understand that not all results are received at the same time and often the doctors may need to interpret multiple results in order to provide you with the best plan of care or course of treatment. Therefore, we ask that you please give us  2 business days to thoroughly review all your results before contacting the office for clarification. Should we see a critical lab result, you will be contacted sooner.   If You Need Anything After Your Visit  If you have any questions or concerns for your doctor, please call our main line at [phone] If no one answers, please leave a voicemail as directed and we will return your call as soon as  possible. Messages left after 4 pm will be answered the following business day.   You may also send us  a message via MyChart. We typically respond to MyChart messages within 1-2 business days.  For prescription refills, please ask your pharmacy to contact our office. Our fax number is [phone].  If you have an urgent issue when the clinic is closed that cannot wait until the next business day, you can page your doctor at the number below.    Please note that while we do our best to be available for urgent issues outside of office hours, we are not available 24/7.   If you have an urgent issue and are unable to reach us , you may choose to seek medical care at your doctor's office, retail clinic, urgent care center, or emergency room.  If you have a medical emergency, please immediately call 911 or go to the emergency department. In the event of inclement weather, please call our main line at [phone] for an update on the status of any delays or closures.  Dermatology Medication Tips: Please keep the boxes that topical medications come in in order to help keep track of the instructions about where and how to use these. Pharmacies typically print the medication instructions only on the boxes and not directly on the medication tubes.   If your medication is too expensive,  please contact our office at [phone] or send us  a message through MyChart.   We are unable to tell what your co-pay for medications will be in advance as this is different depending on your insurance coverage. However, we may be able to find a substitute medication at lower cost or fill out paperwork to get insurance to cover a needed medication.   If a prior authorization is required to get your medication covered by your insurance company, please allow us  1-2 business days to complete this process.  Drug prices often vary depending on where the prescription is filled and some pharmacies may offer cheaper  prices.  The website www.goodrx.com contains coupons for medications through different pharmacies. The prices here do not account for what the cost may be with help from insurance (it may be cheaper with your insurance), but the website can give you the price if you did not use any insurance.  - You can print the associated coupon and take it with your prescription to the pharmacy.  - You may also stop by our office during regular business hours and pick up a GoodRx coupon card.  - If you need your prescription sent electronically to a different pharmacy, notify our office through [facility] or by phone at [phone]

## 2024-08-09 NOTE — Progress Notes (Signed)
   New Patient Visit   Subjective  Jenna Santos is a 18 y.o. female who presents for the following: Her scalp has been flaky for a couple years. It is itchy and when she scratches it becomes very inflamed and gets pus bumps. Itch is about an 8 out 10 almost all the time. She has used Selsun Blue in the past but it did not help. She is not aware of a family history of psoriasis. She does have joint aches in her fingers and hands mostly in the morning when she wakes up.    The following portions of the chart were reviewed this encounter and updated as appropriate: medications, allergies, medical history  Review of Systems:  No other skin or systemic complaints except as noted in HPI or Assessment and Plan.  Objective  Well appearing patient in no apparent distress; mood and affect are within normal limits.   A focused examination was performed of the following areas: Scalp   Relevant exam findings are noted in the Assessment and Plan             Assessment & Plan   PSORIASIS Exam: Silvery well demarcated plaques of scalp and ears. 10% BSA, IGA 3   Psoriasis is a chronic non-curable, but treatable genetic/hereditary disease that may have other systemic features affecting other organ systems such as joints (Psoriatic Arthritis). It is associated with an increased risk of inflammatory bowel disease, heart disease, non-alcoholic fatty liver disease, and depression.  Treatments include light and laser treatments; topical medications; and systemic medications including oral and injectables.  - Assessment: Patient presents with full-size, silvery, well-demarcated erythematous plaques in the scalp and inner ear canal, consistent with psoriasis. Previous treatment with Selsun Blue has been ineffective. No family history of psoriasis reported. Nails are clear. Patient also reports morning joint stiffness in fingers, suggesting possible psoriatic arthritis. This is the patient's  first dermatology consultation for this condition.  - Plan:    Prescribe DHS-Zinc shampoo (2% zinc) - use once weekly to every 2 weeks, let sit for 3 minutes before using regular shampoo    Prescribe clobetasol  topical steroid (liquid form) - apply once daily, or twice daily on trouble spots    Prescribe Dermasmooth oil for scalp - apply for a couple of hours before shampooing to soften flakes    If above treatments are ineffective, consider advancing to biologic therapy (Cosentyx)    Order TB and hepatitis screening tests    Educate patient on psoriasis pathophysiology and treatment options  Follow-up in 8-12 weeks to assess response to treatment.  PSORIASIS   Related Procedures Hepatitis B surface antibody,qualitative Hepatitis B surface antigen Hepatitis C antibody QuantiFERON-TB Gold Plus  Return for 6-8 weeks , Psoriasis.  I, Roseline Hutchinson, CMA, am acting as scribe for [employer], DO .   Documentation: I have reviewed the above documentation for accuracy and completeness, and I agree with the above.  Delon Lenis, DO

## 2024-08-16 ENCOUNTER — Encounter: Payer: Self-pay | Admitting: "Endocrinology

## 2024-08-18 ENCOUNTER — Encounter (HOSPITAL_COMMUNITY): Payer: Self-pay

## 2024-08-18 ENCOUNTER — Ambulatory Visit (HOSPITAL_COMMUNITY)
Admission: EM | Admit: 2024-08-18 | Discharge: 2024-08-18 | Disposition: A | Discharge status: Home / Self Care | Attending: Physician Assistant | Admitting: Physician Assistant

## 2024-08-18 DIAGNOSIS — R1084 Generalized abdominal pain: Secondary | ICD-10-CM | POA: Insufficient documentation

## 2024-08-18 DIAGNOSIS — R102 Pelvic and perineal pain: Secondary | ICD-10-CM | POA: Diagnosis not present

## 2024-08-18 LAB — POCT URINE PREGNANCY: Preg Test, Ur: NEGATIVE

## 2024-08-18 LAB — POCT URINALYSIS DIP (MANUAL ENTRY)
Bilirubin, UA: NEGATIVE
Glucose, UA: NEGATIVE mg/dL
Ketones, POC UA: NEGATIVE mg/dL
Leukocytes, UA: NEGATIVE
Nitrite, UA: NEGATIVE
Protein Ur, POC: NEGATIVE mg/dL
Spec Grav, UA: 1.015 (ref 1.010–1.025)
Urobilinogen, UA: 0.2 U/dL
pH, UA: 6 (ref 5.0–8.0)

## 2024-08-18 NOTE — ED Provider Notes (Signed)
 [facility]    CSN: [id number] Arrival date & time: 08/18/24  1527      History   Chief Complaint Chief Complaint  Patient presents with   Pelvic Pain   Abdominal Cramping    HPI Jenna Santos is a 18 y.o. female.   Patient presents with lower abdominal pain and pelvic pain that started several months ago.  She was seen in the ED back in June.  Treated with Flagyl  for BV.  She reports minimal improvement.  She denies discharge, vaginal irritation or itching.  She does report some constipation.  Reports last bowel movement was about 1 week ago.  She has not seen OB/GYN.  Denies fever, chills, dysuria, nausea, vomiting.    Past Medical History:  Diagnosis Date   Acanthosis nigricans 10/17/2018   Amenorrhea 03/31/2020   Attention deficit hyperactivity disorder (ADHD) 07/22/2022   Dysmenorrhea 03/15/2022   Elevated hemoglobin A1c 11/07/2018   Obesity    Tinnitus of both ears 09/12/2023    Patient Active Problem List   Diagnosis Date Noted   Scalp irritation 01/16/2024   Flaking of scalp 01/16/2024   Dizziness 09/12/2023   Headache in back of head 09/12/2023   Nausea without vomiting 09/12/2023   Pre-diabetes 10/18/2018    Past Surgical History:  Procedure Laterality Date   WISDOM TOOTH EXTRACTION Bilateral 2024    OB History     Gravida  0   Para  0   Term  0   Preterm  0   AB  0   Living  0      SAB  0   IAB  0   Ectopic  0   Multiple  0   Live Births  0            Home Medications    Prior to Admission medications   Medication Sig Start Date End Date Taking? Authorizing Provider  albuterol  (VENTOLIN  HFA) 108 (90 Base) MCG/ACT inhaler Inhale 1-2 puffs into the lungs every 6 (six) hours as needed for wheezing or shortness of breath. 03/29/24  Yes Gladis Elsie BROCKS, PA-C  Carbinoxamine  Maleate ER 4 MG/5ML SUER Take 10 mLs by mouth at bedtime as needed. 04/05/24  Yes Klett, Macario HERO, NP  gabapentin  (NEURONTIN ) 100 MG  tablet Take 1 tablet every night 05/14/24  Yes Georjean Darice HERO, MD  lamoTRIgine (LAMICTAL) 150 MG tablet Take 150 mg by mouth at bedtime. 01/06/24  Yes [provider]  methylphenidate  36 MG PO CR tablet Take 1 tablet (36 mg total) by mouth daily with breakfast. 02/21/24  Yes Joshua Bari HERO, NP  ondansetron  (ZOFRAN -ODT) 4 MG disintegrating tablet Take 1 tablet (4 mg total) by mouth every 8 (eight) hours as needed for nausea. 02/08/24  Yes Hoy Fraction F, PA-C  Semaglutide -Weight Management (WEGOVY ) 1.7 MG/0.75ML SOAJ Inject 1.7 mg into the skin once a week. 07/12/24  Yes Motwani, Komal, MD  traZODone (DESYREL) 50 MG tablet Take 50-100 mg by mouth at bedtime as needed. 04/12/24  Yes [provider]  VRAYLAR 1.5 MG capsule Take 1.5 mg by mouth daily. 04/20/24  Yes [provider]  clobetasol  (TEMOVATE ) 0.05 % external solution Apply 1 Application topically daily. Apply to scalp daily 08/09/24   Alm Delon SAILOR, DO  EPINEPHrine  (EPIPEN  2-PAK) 0.3 mg/0.3 mL IJ SOAJ injection Inject 0.3 mg into the muscle as needed for anaphylaxis. 09/12/23   Belenda Macario HERO, NP  Fluocinolone  Acetonide Scalp (DERMA-SMOOTHE JANA SCALP) 0.01 % OIL  Apply 1 application  topically as directed. Apply to scalp a couple hours before shampooing. 08/09/24   Alm Delon SAILOR, DO  fluticasone  (FLONASE ) 50 MCG/ACT nasal spray Place 2 sprays into both nostrils daily. 03/21/24 05/14/24  Rothstein, Chloe E, NP  levocetirizine (XYZAL ) 5 MG tablet Take 1 tablet (5 mg total) by mouth daily. 04/05/24 05/14/24  Belenda Macario HERO, NP  levonorgestrel  (MIRENA ) 20 MCG/DAY IUD 1 each by Intrauterine route once.    [provider]  metroNIDAZOLE  (METROGEL ) 0.75 % vaginal gel Place 1 Applicatorful vaginally 2 (two) times daily. 06/02/24   Odell Balls, PA-C    Family History Family History  Problem Relation Age of Onset   Arthritis Maternal Grandmother    Hyperlipidemia Maternal Grandmother    Hypertension Maternal  Grandmother    Stroke Maternal Grandmother    Cancer Maternal Grandfather        stomach   Diabetes Maternal Grandfather    Alcohol abuse Neg Hx    Asthma Neg Hx    Birth defects Neg Hx    COPD Neg Hx    Depression Neg Hx    Drug abuse Neg Hx    Early death Neg Hx    Hearing loss Neg Hx    Heart disease Neg Hx    Kidney disease Neg Hx    Learning disabilities Neg Hx    Mental illness Neg Hx    Mental retardation Neg Hx    Miscarriages / Stillbirths Neg Hx    Vision loss Neg Hx    Varicose Veins Neg Hx     Social History Social History   Tobacco Use   Smoking status: Never    Passive exposure: Never   Smokeless tobacco: Never  Vaping Use   Vaping status: Never Used  Substance Use Topics   Alcohol use: No   Drug use: No     Allergies   Banana   Review of Systems Review of Systems  Constitutional:  Negative for chills and fever.  HENT:  Negative for ear pain and sore throat.   Eyes:  Negative for pain and visual disturbance.  Respiratory:  Negative for cough and shortness of breath.   Cardiovascular:  Negative for chest pain and palpitations.  Gastrointestinal:  Positive for abdominal pain. Negative for vomiting.  Genitourinary:  Positive for pelvic pain. Negative for dysuria and hematuria.  Musculoskeletal:  Negative for arthralgias and back pain.  Skin:  Negative for color change and rash.  Neurological:  Negative for seizures and syncope.  All other systems reviewed and are negative.    Physical Exam Triage Vital Signs ED Triage Vitals  Encounter Vitals Group     BP 08/18/24 1623 91/60     Girls Systolic BP Percentile --      Girls Diastolic BP Percentile --      Boys Systolic BP Percentile --      Boys Diastolic BP Percentile --      Pulse Rate 08/18/24 1623 79     Resp 08/18/24 1623 18     Temp 08/18/24 1623 98.5 F (36.9 C)     Temp Source 08/18/24 1623 Oral     SpO2 08/18/24 1627 98 %     Weight --      Height --      Head Circumference  --      Peak Flow --      Pain Score 08/18/24 1628 6     Pain Loc --  Pain Education --      Exclude from Growth Chart --    No data found.  Updated Vital Signs BP 91/60 (BP Location: Left Arm)   Pulse 79   Temp 98.5 F (36.9 C) (Oral)   Resp 18   LMP  (Approximate)   SpO2 98%   Visual Acuity Right Eye Distance:   Left Eye Distance:   Bilateral Distance:    Right Eye Near:   Left Eye Near:    Bilateral Near:     Physical Exam Vitals and nursing note reviewed.  Constitutional:      General: She is not in acute distress.    Appearance: She is well-developed.  HENT:     Head: Normocephalic and atraumatic.  Eyes:     Conjunctiva/sclera: Conjunctivae normal.  Cardiovascular:     Rate and Rhythm: Normal rate and regular rhythm.     Heart sounds: No murmur heard. Pulmonary:     Effort: Pulmonary effort is normal. No respiratory distress.     Breath sounds: Normal breath sounds.  Abdominal:     Palpations: Abdomen is soft.     Tenderness: There is no abdominal tenderness.  Musculoskeletal:        General: No swelling.     Cervical back: Neck supple.  Skin:    General: Skin is warm and dry.     Capillary Refill: Capillary refill takes less than 2 seconds.  Neurological:     Mental Status: She is alert.  Psychiatric:        Mood and Affect: Mood normal.      UC Treatments / Results  Labs (all labs ordered are listed, but only abnormal results are displayed) Labs Reviewed  POCT URINALYSIS DIP (MANUAL ENTRY) - Abnormal; Notable for the following components:      Result Value   Clarity, UA cloudy (*)    Blood, UA large (*)    All other components within normal limits  POCT URINE PREGNANCY  CERVICOVAGINAL ANCILLARY ONLY    EKG   Radiology No results found.  Procedures Procedures (including critical care time)  Medications Ordered in UC Medications - No data to display  Initial Impression / Assessment and Plan / UC Course  I have reviewed the  triage vital signs and the nursing notes.  Pertinent labs & imaging results that were available during my care of the patient were reviewed by me and considered in my medical decision making (see chart for details).     Abdominal pain/pelvic pain.  UA normal in clinic today.  Cervicovaginal self swab sent.  Will treat if indicated based on these results.  Recommended supportive care for constipation.  Advise follow-up with OB/GYN since has been going for several months. Final Clinical Impressions(s) / UC Diagnoses   Final diagnoses:  Generalized abdominal pain  Pelvic pain in female     Discharge Instructions      Your urine shows no signs of infection Will call with test results and initiate any necessary treatment Recommend a stool softener for constipation Can take Tylenol  or Ibuprofen  as needed for pain Recommend a follow up with OBGYN.    ED Prescriptions   None    PDMP not reviewed this encounter.   Ward, Harlene PEDLAR, PA-C 08/18/24 1754

## 2024-08-18 NOTE — Discharge Instructions (Addendum)
 Your urine shows no signs of infection Will call with test results and initiate any necessary treatment Recommend a stool softener for constipation Can take Tylenol  or Ibuprofen  as needed for pain Recommend a follow up with OBGYN.

## 2024-08-18 NOTE — ED Triage Notes (Signed)
 Patient present to the office for lower abdominal pain and pelvic pain.Started several month ago. Patient has not taken any medication to help her symptoms.

## 2024-08-20 LAB — CERVICOVAGINAL ANCILLARY ONLY
Bacterial Vaginitis (gardnerella): NEGATIVE
Candida Glabrata: NEGATIVE
Candida Vaginitis: NEGATIVE
Chlamydia: NEGATIVE
Comment: NEGATIVE
Comment: NEGATIVE
Comment: NEGATIVE
Comment: NEGATIVE
Comment: NEGATIVE
Comment: NORMAL
Neisseria Gonorrhea: NEGATIVE
Trichomonas: NEGATIVE

## 2024-08-24 ENCOUNTER — Encounter: Payer: Self-pay | Admitting: "Endocrinology

## 2024-08-24 ENCOUNTER — Telehealth: Payer: Self-pay

## 2024-08-24 ENCOUNTER — Other Ambulatory Visit (HOSPITAL_COMMUNITY): Payer: Self-pay

## 2024-08-24 ENCOUNTER — Telehealth (INDEPENDENT_AMBULATORY_CARE_PROVIDER_SITE_OTHER): Admitting: "Endocrinology

## 2024-08-24 DIAGNOSIS — E88819 Insulin resistance, unspecified: Secondary | ICD-10-CM

## 2024-08-24 DIAGNOSIS — E282 Polycystic ovarian syndrome: Secondary | ICD-10-CM

## 2024-08-24 DIAGNOSIS — R7303 Prediabetes: Secondary | ICD-10-CM | POA: Diagnosis not present

## 2024-08-24 MED ORDER — WEGOVY 1.7 MG/0.75ML ~~LOC~~ SOAJ: 1.7000 mg | SUBCUTANEOUS | 3 refills | Status: DC

## 2024-08-24 NOTE — Telephone Encounter (Signed)
 Pharmacy Patient Advocate Encounter   Received notification from CoverMyMeds that prior authorization for Wegovy  1.7MG /0.75ML auto-injectors is required/requested.   Insurance verification completed.   The patient is insured through [employer] .   Per test claim: PA required; PA submitted to above mentioned insurance via Latent Key/confirmation #/EOC [id number] Status is pending

## 2024-08-24 NOTE — Telephone Encounter (Signed)
 Call to patient to follow up on home sleep study that has not been done. Patient states she never came in to pick it up. She is requesting sleep study be mailed to her home. Message to sleep studies pool to see if new order has to be put in for a device to be mailed out to patient.

## 2024-08-24 NOTE — Patient Instructions (Addendum)
 Recommend weighing weekly   To get the most accurate readings on the scale, follow these tips:  Weigh yourself at the same time of day to get an accurate comparison. Use the bathroom before weighing yourself. Wear as little clothing as possible when you weigh yourself, and try to wear the same or similar clothing each time you weigh in. Put your scale on a hard, flat surface, as a scale on an uneven surface won't read correctly. Stand still, barefoot, on the scale with your weight evenly distributed on both feet. If you're weighing yourself weekly, also remember to do it on the same day every week.   Maintain healthy lifestyle including 1200 Cal/day, 30 min of activity/day, avoiding refined/processed/outside food 20 minutes physical activity per day, in continuum or interruptedly through the day  Goals: less than 60 grams of carbohydrate/meal, 1200-1500 Cal/day, 10,0000 steps a day and weight loss of 0.5-1 lb/ wk  Sleep 7-9 hours/day, adapt good sleep hygiene Adapt de-stressing and relaxation techniques to prevent stress induced weight gain Avoid/switch medications that lead to weight gain by discussing with the prescribing physician

## 2024-08-24 NOTE — Progress Notes (Signed)
 The patient reports they are currently: Jenna Santos. I spent 5 minutes on the video with the patient on the date of service. I spent an additional 5 minutes on pre- and post-visit activities on the date of service.   The patient was physically located in [place]  or a state in which I am permitted to provide care. The patient and/or parent/guardian understood that s/he may incur co-pays and cost sharing, and agreed to the telemedicine visit. The visit was reasonable and appropriate under the circumstances given the patient's presentation at the time.  The patient and/or parent/guardian has been advised of the potential risks and limitations of this mode of treatment (including, but not limited to, the absence of in-person examination) and has agreed to be treated using telemedicine. The patient's/patient's family's questions regarding telemedicine have been answered.   The patient and/or parent/guardian has also been advised to contact their provider's office for worsening conditions, and seek emergency medical treatment and/or call 911 if the patient deems either necessary.     Outpatient Endocrinology Note [name], MD    Carlyle Achenbach [facility] 2006/11/03 [id number]  Referring Provider: Belenda Macario HERO, NP Primary Care Provider: Belenda Macario HERO, NP Reason for consultation: Subjective   Assessment & Plan  Diagnoses and all orders for this visit:  PCOS (polycystic ovarian syndrome)  Insulin  resistance  Pre-diabetes  Other orders -     semaglutide -weight management (WEGOVY ) 1.7 MG/0.75ML SOAJ SQ injection; Inject 1.7 mg into the skin once a week.   PCOS with morbid obesity, complicated by prediabetes, insulin  resistance, elevated serum cortisol  Not able to tolerate regular metformin  500 mg every day due to nausea and feeling drained Recommend weighing weekly 2025: Not taking metformin  XR 500mg  two pills for 2 weeks -felt super sick due to nausea  No history of MEN  syndrome/medullary thyroid  cancer/pancreatitis or pancreatic cancer in self or family Pt interested in weight loss Discussed lifestyle changes, medical management as well as bariatric surgery previously  Maintain healthy lifestyle including 1200 Cal/day, 30 min of activity/day, avoiding refined/processed/outside food 20 minutes physical activity per day, in continuum or interruptedly through the day  Goals: less than 60 grams of carbohydrate/meal, 1200-1500 Cal/day, 10,0000 steps a day and weight loss of 0.5-1 lb/ wk  Sleep 7-9 hours/day, adapt good sleep hygiene Adapt de-stressing and relaxation techniques to prevent stress induced weight gain Avoid/switch medications that lead to weight gain by discussing with the prescribing physician   Trulicity  not covered unless fails metformin  Was 226 lbs before starting it 07/12/24: Increased to Wegovy  1.7mg /week Continue current dose given vomiting with heavy meals Discussed about dehydration and avoiding heavy/fatty meals  Elevated serum cortisol level in the morning Ordered 1 mg dexamethasone  suppression test again, patient did not do it previously, again not done  No history of adrenal adenoma  PCOS based off of history of irregular menstrual cycle, currently on IUD managed by OB/GYN Patient has mild hirsutism  I have reviewed current medications, nurse's notes, allergies, vital signs, past medical and surgical history, family medical history, and social history for this encounter. Counseled patient on symptoms, examination findings, lab findings, imaging results, treatment decisions and monitoring and prognosis. The patient understood the recommendations and agrees with the treatment plan. All questions regarding treatment plan were fully answered.  Return in about 2 months (around 10/24/2024).   [name], MD  08/24/24   History of Present Illness HPI  Jenna Santos is a 18 y.o. year old female who presents for follow-up  of  prediabetes/insulin  resistance/grade 2 obesity/elevated cortisol.  Patient is currently taking Wegovy  1.7 mg/week Has been having some vomiting, specially with heavy meals  Not on metformin   Based off of her weight, patient has lost 14 pounds since on Wegovy , no recent weight  Did not do 1 mg dexamethasone  suppression test, reports nobody called her  Initial history:  Saw an endocrinologist in past due to high A1C Limits sugars, always walking, 3-5 mi/day Never been on steroids  Started weight gain during middle school slowly over time and then again during Covid time due to personal stress   Tried metformin  500 mg every day, stopped due to nausea and feeling drained  On wegovy  0.5 mg/week - has no side effects 2025: Not taking metformin  XR 500mg  two pills for 2 weeks -felt super sick due to nausea  Patient questions if she has PCOS.  She meets 2 out of 3 NIH Criteria for PCOS: Irregular menstrual cycles: history of both Oligomenorrhea (infrequent periods) or anovulation (absence of periods). Has IUD now. Clinical or biochemical hyperandrogenism: acne and hirsutism (excessive hair growth) Polycystic ovaries: no ultrasound imaging  Physical Exam  There were no vitals taken for this visit.   Constitutional: well developed, well nourished Head: normocephalic, atraumatic Eyes: sclera anicteric, no redness Neck: supple Lungs: normal respiratory effort Neurology: alert and oriented Skin: dry, no appreciable rashes Musculoskeletal: no appreciable defects Psychiatric: normal mood and affect   Current Medications Patient's Medications  New Prescriptions   No medications on file  Previous Medications   ALBUTEROL  (VENTOLIN  HFA) 108 (90 BASE) MCG/ACT INHALER    Inhale 1-2 puffs into the lungs every 6 (six) hours as needed for wheezing or shortness of breath.   CARBINOXAMINE  MALEATE ER 4 MG/5ML SUER    Take 10 mLs by mouth at bedtime as needed.   CLOBETASOL  (TEMOVATE ) 0.05 %  EXTERNAL SOLUTION    Apply 1 Application topically daily. Apply to scalp daily   EPINEPHRINE  (EPIPEN  2-PAK) 0.3 MG/0.3 ML IJ SOAJ INJECTION    Inject 0.3 mg into the muscle as needed for anaphylaxis.   FLUOCINOLONE  ACETONIDE SCALP (DERMA-SMOOTHE /FS SCALP) 0.01 % OIL    Apply 1 application  topically as directed. Apply to scalp a couple hours before shampooing.   FLUTICASONE  (FLONASE ) 50 MCG/ACT NASAL SPRAY    Place 2 sprays into both nostrils daily.   GABAPENTIN  (NEURONTIN ) 100 MG TABLET    Take 1 tablet every night   LAMOTRIGINE (LAMICTAL) 150 MG TABLET    Take 150 mg by mouth at bedtime.   LEVOCETIRIZINE (XYZAL ) 5 MG TABLET    Take 1 tablet (5 mg total) by mouth daily.   LEVONORGESTREL  (MIRENA ) 20 MCG/DAY IUD    1 each by Intrauterine route once.   METHYLPHENIDATE  36 MG PO CR TABLET    Take 1 tablet (36 mg total) by mouth daily with breakfast.   METRONIDAZOLE  (METROGEL ) 0.75 % VAGINAL GEL    Place 1 Applicatorful vaginally 2 (two) times daily.   ONDANSETRON  (ZOFRAN -ODT) 4 MG DISINTEGRATING TABLET    Take 1 tablet (4 mg total) by mouth every 8 (eight) hours as needed for nausea.   TRAZODONE (DESYREL) 50 MG TABLET    Take 50-100 mg by mouth at bedtime as needed.   VRAYLAR 1.5 MG CAPSULE    Take 1.5 mg by mouth daily.  Modified Medications   Modified Medication Previous Medication   SEMAGLUTIDE -WEIGHT MANAGEMENT (WEGOVY ) 1.7 MG/0.75ML SOAJ SQ INJECTION Semaglutide -Weight Management (WEGOVY ) 1.7 MG/0.75ML SOAJ  Inject 1.7 mg into the skin once a week.    Inject 1.7 mg into the skin once a week.  Discontinued Medications   No medications on file    Allergies Allergies  Allergen Reactions   Banana Anaphylaxis    Past Medical History Past Medical History:  Diagnosis Date   Acanthosis nigricans 10/17/2018   Amenorrhea 03/31/2020   Attention deficit hyperactivity disorder (ADHD) 07/22/2022   Dysmenorrhea 03/15/2022   Elevated hemoglobin A1c 11/07/2018   Obesity    Tinnitus of both  ears 09/12/2023    Past Surgical History Past Surgical History:  Procedure Laterality Date   WISDOM TOOTH EXTRACTION Bilateral 2024    Family History family history includes Arthritis in her maternal grandmother; Cancer in her maternal grandfather; Diabetes in her maternal grandfather; Hyperlipidemia in her maternal grandmother; Hypertension in her maternal grandmother; Stroke in her maternal grandmother.  Social History Social History   Socioeconomic History   Marital status: Single    Spouse name: Not on file   Number of children: Not on file   Years of education: Not on file   Highest education level: Not on file  Occupational History   Not on file  Tobacco Use   Smoking status: Never    Passive exposure: Never   Smokeless tobacco: Never  Vaping Use   Vaping status: Never Used  Substance and Sexual Activity   Alcohol use: No   Drug use: No   Sexual activity: Yes    Birth control/protection: Condom, I.U.D.  Other Topics Concern   Not on file  Social History Narrative   Lives at home with mom   Father not involved   12th grade at [employer]   Left handed          Social Drivers of Health   Financial Resource Strain: Not on file  Food Insecurity: Not on file  Transportation Needs: Not on file  Physical Activity: Not on file  Stress: Not on file  Social Connections: Not on file  Intimate Partner Violence: Not on file    Lab Results  Component Value Date   CHOL 150 12/21/2023   Lab Results  Component Value Date   HDL 63 12/21/2023   Lab Results  Component Value Date   LDLCALC 76 12/21/2023   Lab Results  Component Value Date   TRIG 37 12/21/2023   Lab Results  Component Value Date   CHOLHDL 2.4 12/21/2023   Lab Results  Component Value Date   CREATININE 1.21 (H) 06/02/2024   No results found for: GFR    Component Value Date/Time   NA 137 06/02/2024 0058   K 3.7 06/02/2024 0058   CL 101 06/02/2024 0058   CO2 25  06/02/2024 0058   GLUCOSE 94 06/02/2024 0058   BUN 13 06/02/2024 0058   CREATININE 1.21 (H) 06/02/2024 0058   CREATININE 0.85 12/21/2023 0920   CALCIUM 9.7 06/02/2024 0058   PROT 7.8 06/02/2024 0058   ALBUMIN 4.3 06/02/2024 0058   AST 17 06/02/2024 0058   ALT 9 06/02/2024 0058   ALKPHOS 73 06/02/2024 0058   BILITOT 0.6 06/02/2024 0058   GFRNONAA >60 06/02/2024 0058      Latest Ref Rng & Units 06/02/2024   12:58 AM 02/08/2024    3:32 PM 02/08/2024    1:29 AM  BMP  Glucose 70 - 99 mg/dL 94  68  84   BUN 6 - 20 mg/dL 13  20  15  Creatinine 0.44 - 1.00 mg/dL 8.78  9.03  8.92   Sodium 135 - 145 mmol/L 137  140  140   Potassium 3.5 - 5.1 mmol/L 3.7  4.9  4.3   Chloride 98 - 111 mmol/L 101  104  105   CO2 22 - 32 mmol/L 25  23  24    Calcium 8.9 - 10.3 mg/dL 9.7  89.9  9.8        Component Value Date/Time   WBC 10.5 06/02/2024 0058   RBC 5.81 (H) 06/02/2024 0058   HGB 13.4 06/02/2024 0058   HCT 44.7 06/02/2024 0058   PLT 317 06/02/2024 0058   MCV 76.9 (L) 06/02/2024 0058   MCH 23.1 (L) 06/02/2024 0058   MCHC 30.0 06/02/2024 0058   RDW 15.0 06/02/2024 0058   LYMPHSABS 1.9 02/08/2024 1532   MONOABS 0.7 02/08/2024 1532   EOSABS 0.0 02/08/2024 1532   BASOSABS 0.0 02/08/2024 1532   Lab Results  Component Value Date   TSH 2.08 12/21/2023   TSH 1.54 10/17/2023   TSH 1.23 10/17/2018   FREET4 1.2 12/21/2023   FREET4 1.1 10/17/2023   FREET4 1.1 10/17/2018         Parts of this note may have been dictated using voice recognition software. There may be variances in spelling and vocabulary which are unintentional. Not all errors are proofread. Please notify the dino if any discrepancies are noted or if the meaning of any statement is not clear.

## 2024-08-30 NOTE — Telephone Encounter (Signed)
 Your request has been approved PA Case: [id number], Status: Approved, Coverage Starts on: 08/24/2024 12:00:00 AM, Coverage Ends on: 08/24/2025 12:00:00 AM. Authorization Expiration09/11/2025

## 2024-09-06 ENCOUNTER — Ambulatory Visit: Admitting: Obstetrics and Gynecology

## 2024-09-10 ENCOUNTER — Encounter: Payer: Self-pay | Admitting: Dermatology

## 2024-09-12 ENCOUNTER — Encounter: Payer: Self-pay | Admitting: Neurology

## 2024-09-12 ENCOUNTER — Ambulatory Visit: Payer: Self-pay | Admitting: Neurology

## 2024-09-21 ENCOUNTER — Encounter: Payer: Self-pay | Admitting: Physician Assistant

## 2024-09-21 ENCOUNTER — Ambulatory Visit: Admitting: Physician Assistant

## 2024-09-21 VITALS — BP 112/66 | HR 74 | Ht 62.5 in | Wt 211.4 lb

## 2024-09-21 DIAGNOSIS — R102 Pelvic and perineal pain unspecified side: Secondary | ICD-10-CM | POA: Diagnosis not present

## 2024-09-21 DIAGNOSIS — M7918 Myalgia, other site: Secondary | ICD-10-CM | POA: Diagnosis not present

## 2024-09-21 MED ORDER — NAPROXEN 500 MG PO TABS: 500.0000 mg | ORAL_TABLET | Freq: Two times a day (BID) | ORAL | 2 refills | Status: DC

## 2024-09-21 NOTE — Progress Notes (Signed)
 Pt c/o pelvic pain for since June. She was seen in the ED for this in June and Urgent care in September. Pt reports pain is worsening. Pelvic US  done 06-02-24  IUD placed 1 year ago   PHQ9 and GAD 7 elevated. Pt seeing therapist

## 2024-09-21 NOTE — Progress Notes (Unsigned)
 GYNECOLOGY  VISIT   HPI: Jenna Santos is a 18 y.o.  single female  [id number] here for intermittent, daily pain throughout lower abdomen/pelvis that sometimes radiates to her back. This began in early June, is characterized as someone stepping on her, with pain ranging from 4-9 out of 10. She notices that 500 mg Tylenol  reduces the pain mildly, and nothing aggravates it. She denies any specific timing around the pain. Patient does not have any known GI, GU, or MSK problems. She does not have regular periods as she's had Mirena  IUD for past year.   The patient was seen at ED twice for this issue since her pain started, with no abnormal findings on pelvic ultrasound (IUD in place), vaginal swabs, and urinalysis.   The patient reports a history of PCOS for which she takes Wegovy . She reports experiencing frequent vomiting with meals since beginning this medication in February.   +Dyspareunia -Fever, dysuria, urinary frequency/urgency, constipation, diarrhea  Mhx: PCOS, bipolar I  Social: Patient denies smoking/vaping, alcohol or recreational drug use Shx: wisdom tooth extraction Fhx: Negative for pain syndromes   GYNECOLOGIC HISTORY: No LMP recorded. (Menstrual status: IUD). Contraception: IUD   Menopausal hormone therapy: premenopausal Last mammogram: never done due to age Last pap smear: never done due to age        32 History     Gravida  0   Para  0   Term  0   Preterm  0   AB  0   Living  0      SAB  0   IAB  0   Ectopic  0   Multiple  0   Live Births  0              Patient Active Problem List   Diagnosis Date Noted   Scalp irritation 01/16/2024   Flaking of scalp 01/16/2024   Dizziness 09/12/2023   Headache in back of head 09/12/2023   Nausea without vomiting 09/12/2023   Pre-diabetes 10/18/2018    Past Medical History:  Diagnosis Date   Acanthosis nigricans 10/17/2018   Amenorrhea 03/31/2020   Attention deficit hyperactivity disorder  (ADHD) 07/22/2022   Dysmenorrhea 03/15/2022   Elevated hemoglobin A1c 11/07/2018   Obesity    Tinnitus of both ears 09/12/2023    Past Surgical History:  Procedure Laterality Date   WISDOM TOOTH EXTRACTION Bilateral 2024    Current Outpatient Medications  Medication Sig Dispense Refill   albuterol  (VENTOLIN  HFA) 108 (90 Base) MCG/ACT inhaler Inhale 1-2 puffs into the lungs every 6 (six) hours as needed for wheezing or shortness of breath. 6.7 g 0   clobetasol  (TEMOVATE ) 0.05 % external solution Apply 1 Application topically daily. Apply to scalp daily 50 mL 3   EPINEPHrine  (EPIPEN  2-PAK) 0.3 mg/0.3 mL IJ SOAJ injection Inject 0.3 mg into the muscle as needed for anaphylaxis. 2 each 6   Fluocinolone  Acetonide Scalp (DERMA-SMOOTHE /FS SCALP) 0.01 % OIL Apply 1 application  topically as directed. Apply to scalp a couple hours before shampooing. 118 mL 2   fluticasone  (FLONASE ) 50 MCG/ACT nasal spray Place 2 sprays into both nostrils daily. 16 g 12   gabapentin  (NEURONTIN ) 100 MG tablet Take 1 tablet every night 30 tablet 6   lamoTRIgine (LAMICTAL) 150 MG tablet Take 150 mg by mouth at bedtime.     levocetirizine (XYZAL ) 5 MG tablet Take 1 tablet (5 mg total) by mouth daily. 30 tablet 2   levonorgestrel  (MIRENA ) 20 MCG/DAY  IUD 1 each by Intrauterine route once.     methylphenidate  36 MG PO CR tablet Take 1 tablet (36 mg total) by mouth daily with breakfast. 30 tablet 0   naproxen (NAPROSYN) 500 MG tablet Take 1 tablet (500 mg total) by mouth 2 (two) times daily with a meal. As needed for pain 60 tablet 2   ondansetron  (ZOFRAN -ODT) 4 MG disintegrating tablet Take 1 tablet (4 mg total) by mouth every 8 (eight) hours as needed for nausea. 10 tablet 0   semaglutide -weight management (WEGOVY ) 1.7 MG/0.75ML SOAJ SQ injection Inject 1.7 mg into the skin once a week. 3 mL 3   traZODone (DESYREL) 50 MG tablet Take 50-100 mg by mouth at bedtime as needed.     VRAYLAR 1.5 MG capsule Take 1.5 mg by mouth  daily.     Carbinoxamine  Maleate ER 4 MG/5ML SUER Take 10 mLs by mouth at bedtime as needed. (Patient not taking: Reported on 09/21/2024) 480 mL 0   metroNIDAZOLE  (METROGEL ) 0.75 % vaginal gel Place 1 Applicatorful vaginally 2 (two) times daily. (Patient not taking: Reported on 09/21/2024) 70 g 0   No current facility-administered medications for this visit.     ALLERGIES: Banana  Family History  Problem Relation Age of Onset   Arthritis Maternal Grandmother    Hyperlipidemia Maternal Grandmother    Hypertension Maternal Grandmother    Stroke Maternal Grandmother    Cancer Maternal Grandfather        stomach   Diabetes Maternal Grandfather    Alcohol abuse Neg Hx    Asthma Neg Hx    Birth defects Neg Hx    COPD Neg Hx    Depression Neg Hx    Drug abuse Neg Hx    Early death Neg Hx    Hearing loss Neg Hx    Heart disease Neg Hx    Kidney disease Neg Hx    Learning disabilities Neg Hx    Mental illness Neg Hx    Mental retardation Neg Hx    Miscarriages / Stillbirths Neg Hx    Vision loss Neg Hx    Varicose Veins Neg Hx     Social History   Socioeconomic History   Marital status: Single    Spouse name: Not on file   Number of children: Not on file   Years of education: Not on file   Highest education level: Not on file  Occupational History   Not on file  Tobacco Use   Smoking status: Never    Passive exposure: Never   Smokeless tobacco: Never  Vaping Use   Vaping status: Never Used  Substance and Sexual Activity   Alcohol use: No   Drug use: No   Sexual activity: Yes    Birth control/protection: Condom, I.U.D.  Other Topics Concern   Not on file  Social History Narrative   Lives at home with mom   Father not involved   12th grade at [employer]   Left handed          Social Drivers of Health   Financial Resource Strain: Not on file  Food Insecurity: Not on file  Transportation Needs: Not on file  Physical Activity: Not on file   Stress: Not on file  Social Connections: Not on file  Intimate Partner Violence: Not on file    Review of Systems  PHYSICAL EXAMINATION:    BP 112/66   Pulse 74   Ht 5' 2.5 (1.588 m)  Wt 211 lb 6.4 oz (95.9 kg)   BMI 38.05 kg/m     General appearance: alert, cooperative and appears stated age Head: Normocephalic, without obvious abnormality, atraumatic Neck: no adenopathy, supple, symmetrical, trachea midline and thyroid  normal to inspection and palpation Lungs: clear to auscultation bilaterally Breasts: normal appearance, no masses or tenderness, No nipple retraction or dimpling, No nipple discharge or bleeding, No axillary or supraclavicular adenopathy Heart: regular rate and rhythm Abdomen: soft, non-tender, no masses,  no organomegaly Extremities: extremities normal, atraumatic, no cyanosis or edema Skin: Skin color, texture, turgor normal. No rashes or lesions Lymph nodes: Cervical, supraclavicular, and axillary nodes normal. No abnormal inguinal nodes palpated Neurologic: Grossly normal  Pelvic: External genitalia:  no lesions              Urethra:  normal appearing urethra with no masses, tenderness or lesions              Bartholins and Skenes: normal                 Vagina: normal appearing vagina with normal color and discharge, no lesions              Cervix: no lesions                Bimanual Exam:  Uterus:  normal size, contour, position, consistency, mobility, non-tender              Adnexa: no mass, fullness, tenderness              Rectal exam: {yes no:[id number]}.  Confirms.              Anus:  normal sphincter tone, no lesions  Chaperone was present for exam  ASSESSMENT & PLAN   Pelvic pain 18 year old female patient presenting with daily non-menstrual abdomen/pelvic pain. She has experienced frequent nausea and vomiting since starting Wegovy  in February. Positive Carnett's sign in left upper and lower abdominal quadrants suggest abdominal myofascial  involvement, which may be caused by her chronic emesis.   Pertinent negatives include Murphy's sign, McBurney's point tenderness, and Rovsing's sign. Negative Lasegue and Deri test to suggest back or hip involvement. Low concern for neuropathic etiology of pelvic pain given pain characterization (denies burning, shooting, paresthesias, hot pain, etc), negative allodynia, and does not occur along expected distributions.    Ultrasound in June shows IUD in place and strings were visualized at appropriate length today.   I recommended that patient contact PCP to discuss GLP-1 management so we can rule out chronic emesis as cause of her pain. While no abnormality was detected on June US , will repeat to ensure no new pathology has presented since then. Will treat for now with NSAIDs.  - naproxen (NAPROSYN) 500 MG tablet; Take 1 tablet (500 mg total) by mouth 2 (two) times daily with a meal. As needed for pain  Dispense: 60 tablet; Refill: 2 - US  PELVIC COMPLETE WITH TRANSVAGINAL; Future  2. Myalgia of pelvic floor Patient presenting with dyspareunia.  Bilateral ischiococcygeus muscle tenderness 5/10. Obturator internus tenderness 3/10. No pain in Levator ani muscles.  - Ambulatory referral to Physical Therapy    An After Visit Summary was printed and given to the patient.  Taksh Hjort E Bacilio Abascal, PA-C 10/10/20257:47 PM

## 2024-09-24 ENCOUNTER — Encounter: Payer: Self-pay | Admitting: Physician Assistant

## 2024-09-26 ENCOUNTER — Ambulatory Visit (HOSPITAL_COMMUNITY)

## 2024-09-27 ENCOUNTER — Other Ambulatory Visit: Payer: Self-pay | Admitting: Medical Genetics

## 2024-09-27 DIAGNOSIS — Z006 Encounter for examination for normal comparison and control in clinical research program: Secondary | ICD-10-CM

## 2024-09-29 ENCOUNTER — Ambulatory Visit (HOSPITAL_COMMUNITY)
Admission: RE | Admit: 2024-09-29 | Discharge: 2024-09-29 | Disposition: A | Source: Ambulatory Visit | Attending: Physician Assistant

## 2024-09-29 DIAGNOSIS — R102 Pelvic and perineal pain unspecified side: Secondary | ICD-10-CM | POA: Diagnosis not present

## 2024-10-01 ENCOUNTER — Ambulatory Visit (HOSPITAL_COMMUNITY): Payer: Self-pay | Admitting: Physician Assistant

## 2024-10-04 ENCOUNTER — Ambulatory Visit: Admitting: Dermatology

## 2024-10-23 DIAGNOSIS — F901 Attention-deficit hyperactivity disorder, predominantly hyperactive type: Secondary | ICD-10-CM | POA: Diagnosis not present

## 2024-10-23 DIAGNOSIS — F411 Generalized anxiety disorder: Secondary | ICD-10-CM | POA: Diagnosis not present

## 2024-10-23 DIAGNOSIS — F3131 Bipolar disorder, current episode depressed, mild: Secondary | ICD-10-CM | POA: Diagnosis not present

## 2024-10-30 ENCOUNTER — Encounter: Payer: Self-pay | Admitting: Neurology

## 2024-11-05 NOTE — Telephone Encounter (Signed)
**Note De-Identified Augie Vane Obfuscation** The pt states that she will try to come to the office to pick up a Itamar-HST Device on Wednesday 11/07/24.  She states that she goes to work really early in the morning and works late so it maybe hard for her to come and pick up a device. She asked if someone can pick it up for her and I advised her that they may.  She verbalized understanding and thanked me for my call.

## 2024-11-07 DIAGNOSIS — F411 Generalized anxiety disorder: Secondary | ICD-10-CM | POA: Diagnosis not present

## 2024-11-07 DIAGNOSIS — F3131 Bipolar disorder, current episode depressed, mild: Secondary | ICD-10-CM | POA: Diagnosis not present

## 2024-11-07 DIAGNOSIS — F901 Attention-deficit hyperactivity disorder, predominantly hyperactive type: Secondary | ICD-10-CM | POA: Diagnosis not present

## 2024-11-16 ENCOUNTER — Encounter (HOSPITAL_COMMUNITY): Payer: Self-pay

## 2024-11-16 ENCOUNTER — Ambulatory Visit (HOSPITAL_COMMUNITY): Admission: EM | Admit: 2024-11-16 | Discharge: 2024-11-16 | Disposition: A | Discharge status: Home / Self Care

## 2024-11-16 DIAGNOSIS — H6012 Cellulitis of left external ear: Secondary | ICD-10-CM

## 2024-11-16 MED ORDER — SULFAMETHOXAZOLE-TRIMETHOPRIM 800-160 MG PO TABS: 1.0000 | ORAL_TABLET | Freq: Two times a day (BID) | ORAL | 0 refills | Status: AC

## 2024-11-16 MED ORDER — FLUCONAZOLE 150 MG PO TABS: ORAL_TABLET | ORAL | 1 refills | Status: DC

## 2024-11-16 NOTE — Discharge Instructions (Signed)
 Use warm compresses 20 mins at time a couple times a day. Be sure to complete antibiotics in their entirety. If symptoms do not improve in the next 48 hrs we need to follow-up.

## 2024-11-16 NOTE — ED Provider Notes (Signed)
 [facility]    CSN: [id number] Arrival date & time: 11/16/24  1650      History   Chief Complaint Chief Complaint  Patient presents with   Facial Pain    HPI Jenna Santos is a 18 y.o. female.   Pt presents today due to gradually worsening knot of left ear lobe for the past 2 days. Pt states that she is experiencing left sided face pain as well. Pt denies fever, chills, or nickel allergy. Pt denies use of medicine for symptoms.   The history is provided by the patient.    Past Medical History:  Diagnosis Date   Acanthosis nigricans 10/17/2018   Amenorrhea 03/31/2020   Attention deficit hyperactivity disorder (ADHD) 07/22/2022   Dysmenorrhea 03/15/2022   Elevated hemoglobin A1c 11/07/2018   Obesity    Tinnitus of both ears 09/12/2023    Patient Active Problem List   Diagnosis Date Noted   Scalp irritation 01/16/2024   Flaking of scalp 01/16/2024   Dizziness 09/12/2023   Headache in back of head 09/12/2023   Nausea without vomiting 09/12/2023   Pre-diabetes 10/18/2018    Past Surgical History:  Procedure Laterality Date   WISDOM TOOTH EXTRACTION Bilateral 2024    OB History     Gravida  0   Para  0   Term  0   Preterm  0   AB  0   Living  0      SAB  0   IAB  0   Ectopic  0   Multiple  0   Live Births  0            Home Medications    Prior to Admission medications   Medication Sig Start Date End Date Taking? Authorizing Provider  fluconazole  (DIFLUCAN ) 150 MG tablet Take 1 tab po every 3 days 11/16/24  Yes Andra Krabbe C, PA-C  sulfamethoxazole -trimethoprim  (BACTRIM  DS) 800-160 MG tablet Take 1 tablet by mouth 2 (two) times daily for 7 days. 11/16/24 11/23/24 Yes Andra Krabbe BROCKS, PA-C  albuterol  (VENTOLIN  HFA) 108 (90 Base) MCG/ACT inhaler Inhale 1-2 puffs into the lungs every 6 (six) hours as needed for wheezing or shortness of breath. 03/29/24   Gladis Elsie BROCKS, PA-C  Carbinoxamine  Maleate ER 4  MG/5ML SUER Take 10 mLs by mouth at bedtime as needed. Patient not taking: Reported on 09/21/2024 04/05/24   Klett, Lynn M, NP  clobetasol  (TEMOVATE ) 0.05 % external solution Apply 1 Application topically daily. Apply to scalp daily 08/09/24   Alm Delon SAILOR, DO  EPINEPHrine  (EPIPEN  2-PAK) 0.3 mg/0.3 mL IJ SOAJ injection Inject 0.3 mg into the muscle as needed for anaphylaxis. 09/12/23   Belenda Macario HERO, NP  Fluocinolone  Acetonide Scalp (DERMA-SMOOTHE /FS SCALP) 0.01 % OIL Apply 1 application  topically as directed. Apply to scalp a couple hours before shampooing. 08/09/24   Alm Delon SAILOR, DO  fluticasone  (FLONASE ) 50 MCG/ACT nasal spray Place 2 sprays into both nostrils daily. 03/21/24 09/21/24  Rothstein, Chloe E, NP  gabapentin  (NEURONTIN ) 100 MG tablet Take 1 tablet every night 05/14/24   Georjean Darice HERO, MD  lamoTRIgine (LAMICTAL) 150 MG tablet Take 150 mg by mouth at bedtime. 01/06/24   [provider]  levocetirizine (XYZAL ) 5 MG tablet Take 1 tablet (5 mg total) by mouth daily. 04/05/24 09/21/24  Belenda Macario HERO, NP  levonorgestrel  (MIRENA ) 20 MCG/DAY IUD 1 each by Intrauterine route once.    [provider]  methylphenidate  36 MG  PO CR tablet Take 1 tablet (36 mg total) by mouth daily with breakfast. 02/21/24   Joshua Bari HERO, NP  metroNIDAZOLE  (METROGEL ) 0.75 % vaginal gel Place 1 Applicatorful vaginally 2 (two) times daily. Patient not taking: Reported on 09/21/2024 06/02/24   Odell Balls, PA-C  naproxen  (NAPROSYN ) 500 MG tablet Take 1 tablet (500 mg total) by mouth 2 (two) times daily with a meal. As needed for pain 09/21/24   Nicholaus, Devon E, PA-C  ondansetron  (ZOFRAN -ODT) 4 MG disintegrating tablet Take 1 tablet (4 mg total) by mouth every 8 (eight) hours as needed for nausea. 02/08/24   Hoy Nidia FALCON, PA-C  semaglutide -weight management (WEGOVY ) 1.7 MG/0.75ML SOAJ SQ injection Inject 1.7 mg into the skin once a week. 08/24/24   Motwani, Komal, MD  traZODone (DESYREL)  50 MG tablet Take 50-100 mg by mouth at bedtime as needed. 04/12/24   [provider]  VRAYLAR 1.5 MG capsule Take 1.5 mg by mouth daily. 04/20/24   [provider]    Family History Family History  Problem Relation Age of Onset   Arthritis Maternal Grandmother    Hyperlipidemia Maternal Grandmother    Hypertension Maternal Grandmother    Stroke Maternal Grandmother    Cancer Maternal Grandfather        stomach   Diabetes Maternal Grandfather    Alcohol abuse Neg Hx    Asthma Neg Hx    Birth defects Neg Hx    COPD Neg Hx    Depression Neg Hx    Drug abuse Neg Hx    Early death Neg Hx    Hearing loss Neg Hx    Heart disease Neg Hx    Kidney disease Neg Hx    Learning disabilities Neg Hx    Mental illness Neg Hx    Mental retardation Neg Hx    Miscarriages / Stillbirths Neg Hx    Vision loss Neg Hx    Varicose Veins Neg Hx     Social History Social History   Tobacco Use   Smoking status: Never    Passive exposure: Never   Smokeless tobacco: Never  Vaping Use   Vaping status: Never Used  Substance Use Topics   Alcohol use: No   Drug use: No     Allergies   Banana   Review of Systems Review of Systems   Physical Exam Triage Vital Signs ED Triage Vitals  Encounter Vitals Group     BP 11/16/24 1719 102/76     Girls Systolic BP Percentile --      Girls Diastolic BP Percentile --      Boys Systolic BP Percentile --      Boys Diastolic BP Percentile --      Pulse Rate 11/16/24 1719 87     Resp 11/16/24 1719 16     Temp 11/16/24 1719 99.2 F (37.3 C)     Temp Source 11/16/24 1719 Oral     SpO2 11/16/24 1719 100 %     Weight --      Height --      Head Circumference --      Peak Flow --      Pain Score 11/16/24 1716 7     Pain Loc --      Pain Education --      Exclude from Growth Chart --    No data found.  Updated Vital Signs BP 102/76 (BP Location: Left Arm)   Pulse 87   Temp  99.2 F (37.3 C) (Oral)   Resp 16   SpO2 100%    Visual Acuity Right Eye Distance:   Left Eye Distance:   Bilateral Distance:    Right Eye Near:   Left Eye Near:    Bilateral Near:     Physical Exam Vitals and nursing note reviewed.  Constitutional:      General: She is not in acute distress.    Appearance: Normal appearance. She is not ill-appearing, toxic-appearing or diaphoretic.  HENT:     Head:     Comments: Moderate erythema, tenderness to palpation, and induration of left ear lobe Eyes:     General: No scleral icterus. Cardiovascular:     Rate and Rhythm: Normal rate and regular rhythm.     Heart sounds: Normal heart sounds.  Pulmonary:     Effort: Pulmonary effort is normal. No respiratory distress.     Breath sounds: Normal breath sounds. No wheezing or rhonchi.  Skin:    General: Skin is warm.  Neurological:     Mental Status: She is alert and oriented to person, place, and time.  Psychiatric:        Mood and Affect: Mood normal.        Behavior: Behavior normal.      UC Treatments / Results  Labs (all labs ordered are listed, but only abnormal results are displayed) Labs Reviewed - No data to display  EKG   Radiology No results found.  Procedures Procedures (including critical care time)  Medications Ordered in UC Medications - No data to display  Initial Impression / Assessment and Plan / UC Course  I have reviewed the triage vital signs and the nursing notes.  Pertinent labs & imaging results that were available during my care of the patient were reviewed by me and considered in my medical decision making (see chart for details).    Final Clinical Impressions(s) / UC Diagnoses   Final diagnoses:  Cellulitis of left earlobe     Discharge Instructions      Use warm compresses 20 mins at time a couple times a day. Be sure to complete antibiotics in their entirety. If symptoms do not improve in the next 48 hrs we need to follow-up.     ED Prescriptions     Medication Sig  Dispense Auth. Provider   sulfamethoxazole -trimethoprim  (BACTRIM  DS) 800-160 MG tablet Take 1 tablet by mouth 2 (two) times daily for 7 days. 14 tablet Bertin Inabinet C, PA-C   fluconazole  (DIFLUCAN ) 150 MG tablet Take 1 tab po every 3 days 2 tablet Andra Corean BROCKS, PA-C      PDMP not reviewed this encounter.   Andra Corean BROCKS, PA-C 11/16/24 [phone]

## 2024-11-16 NOTE — ED Triage Notes (Signed)
 Pt states noticed a knot to lt ear lobe 2 days and developed lt facial pain today. Denies taking any meds.

## 2024-11-17 LAB — GENECONNECT MOLECULAR SCREEN: Genetic Analysis Overall Interpretation: NEGATIVE

## 2024-12-10 ENCOUNTER — Other Ambulatory Visit: Payer: Self-pay

## 2024-12-10 ENCOUNTER — Encounter (HOSPITAL_COMMUNITY): Payer: Self-pay

## 2024-12-10 ENCOUNTER — Ambulatory Visit (HOSPITAL_COMMUNITY)
Admission: EM | Admit: 2024-12-10 | Discharge: 2024-12-10 | Disposition: A | Discharge status: Other Acute Inpatient Hospital | Attending: Psychiatry | Admitting: Psychiatry

## 2024-12-10 ENCOUNTER — Emergency Department (EMERGENCY_DEPARTMENT_HOSPITAL)
Admission: EM | Admit: 2024-12-10 | Discharge: 2024-12-11 | Disposition: A | Discharge status: Other Acute Inpatient Hospital | Source: Home / Self Care | Attending: Emergency Medicine | Admitting: Emergency Medicine

## 2024-12-10 DIAGNOSIS — R45851 Suicidal ideations: Secondary | ICD-10-CM | POA: Insufficient documentation

## 2024-12-10 DIAGNOSIS — F319 Bipolar disorder, unspecified: Secondary | ICD-10-CM

## 2024-12-10 DIAGNOSIS — Z79899 Other long term (current) drug therapy: Secondary | ICD-10-CM | POA: Insufficient documentation

## 2024-12-10 DIAGNOSIS — F332 Major depressive disorder, recurrent severe without psychotic features: Secondary | ICD-10-CM | POA: Insufficient documentation

## 2024-12-10 DIAGNOSIS — T1491XA Suicide attempt, initial encounter: Secondary | ICD-10-CM | POA: Insufficient documentation

## 2024-12-10 DIAGNOSIS — F902 Attention-deficit hyperactivity disorder, combined type: Secondary | ICD-10-CM | POA: Insufficient documentation

## 2024-12-10 DIAGNOSIS — F909 Attention-deficit hyperactivity disorder, unspecified type: Secondary | ICD-10-CM | POA: Insufficient documentation

## 2024-12-10 DIAGNOSIS — T50902A Poisoning by unspecified drugs, medicaments and biological substances, intentional self-harm, initial encounter: Secondary | ICD-10-CM

## 2024-12-10 DIAGNOSIS — T43212A Poisoning by selective serotonin and norepinephrine reuptake inhibitors, intentional self-harm, initial encounter: Secondary | ICD-10-CM | POA: Insufficient documentation

## 2024-12-10 LAB — CBC
HCT: 44 % (ref 36.0–46.0)
Hemoglobin: 13.4 g/dL (ref 12.0–15.0)
MCH: 22.9 pg — ABNORMAL LOW (ref 26.0–34.0)
MCHC: 30.5 g/dL (ref 30.0–36.0)
MCV: 75.2 fL — ABNORMAL LOW (ref 80.0–100.0)
Platelets: 298 K/uL (ref 150–400)
RBC: 5.85 MIL/uL — ABNORMAL HIGH (ref 3.87–5.11)
RDW: 15.3 % (ref 11.5–15.5)
WBC: 8.4 K/uL (ref 4.0–10.5)
nRBC: 0 % (ref 0.0–0.2)

## 2024-12-10 LAB — COMPREHENSIVE METABOLIC PANEL WITH GFR
ALT: 15 U/L (ref 0–44)
AST: 21 U/L (ref 15–41)
Albumin: 4.5 g/dL (ref 3.5–5.0)
Alkaline Phosphatase: 87 U/L (ref 38–126)
Anion gap: 10 (ref 5–15)
BUN: 11 mg/dL (ref 6–20)
CO2: 22 mmol/L (ref 22–32)
Calcium: 9.7 mg/dL (ref 8.9–10.3)
Chloride: 105 mmol/L (ref 98–111)
Creatinine, Ser: 0.83 mg/dL (ref 0.44–1.00)
GFR, Estimated: >60 mL/min
Glucose, Bld: 85 mg/dL (ref 70–99)
Potassium: 4.5 mmol/L (ref 3.5–5.1)
Sodium: 138 mmol/L (ref 135–145)
Total Bilirubin: 0.6 mg/dL (ref 0.0–1.2)
Total Protein: 7.8 g/dL (ref 6.5–8.1)

## 2024-12-10 LAB — URINE DRUG SCREEN
Amphetamines: NEGATIVE
Barbiturates: NEGATIVE
Benzodiazepines: NEGATIVE
Cocaine: NEGATIVE
Fentanyl: NEGATIVE
Methadone Scn, Ur: NEGATIVE
Opiates: NEGATIVE
Tetrahydrocannabinol: NEGATIVE

## 2024-12-10 LAB — ETHANOL: Alcohol, Ethyl (B): <15 mg/dL

## 2024-12-10 LAB — ACETAMINOPHEN LEVEL: Acetaminophen (Tylenol), Serum: <10 ug/mL — ABNORMAL LOW (ref 10–30)

## 2024-12-10 LAB — CBG MONITORING, ED: Glucose-Capillary: 92 mg/dL (ref 70–99)

## 2024-12-10 LAB — HCG, SERUM, QUALITATIVE: Preg, Serum: NEGATIVE

## 2024-12-10 LAB — SALICYLATE LEVEL: Salicylate Lvl: <7 mg/dL — ABNORMAL LOW (ref 7.0–30.0)
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
## 2024-12-10 NOTE — ED Notes (Signed)
 Pt stuff was placed in locker 35 due to locker 27 not locking

## 2024-12-10 NOTE — ED Notes (Signed)
 Patient is en route to ED for medical clearance

## 2024-12-10 NOTE — ED Notes (Signed)
 Several attempts were made by this nurse to notify [facility] ED charge that patient is on the way however, call was transferred several places and then this nurse was placed on hold.

## 2024-12-10 NOTE — ED Notes (Signed)
 Ambulance has been called to transport patient.

## 2024-12-10 NOTE — ED Provider Notes (Signed)
 Behavioral Health Urgent Care Medical Screening Exam  Patient Name: Jenna Santos MRN: [id number] Date of Evaluation: 12/10/2024 Chief Complaint:   Diagnosis:  Final diagnoses:  Suicide attempt [facility])    History of Present illness: Jenna Santos is a 18 y.o. female.   Patient presents to [facility] voluntarily, unaccompanied. She presents with a hx of Bipolar, depression and ADHD. Reports that she has been prescribed Lamictal, Trazodone , Vrylar and Concerta .  She reports that she intentionally took 400 mg of Trazodone  after getting in argument with her aunt.  EMS was called and she was transported here. No previous attempts. Denies substance use. Denies HI/AVH.   Patient is evaluated face to face by this provider who consulted with Dr Cole, MD. 18 year old female sitting in the assessment alone. She is appropriately dressed and groomed. Cooperative but appears tired and sleepy.  Alert and oriented. Speech is clear with decreased volume. Her thought process is coherent. She does report feeling sleepy and tired after ingesting 400 mg of Trazodone . She shares that she lives with her aunt who has been taking care of her for a while. Her mother is not very much involved. Father is not around.  Today, patient got into an argument with her aunt  I had a big mouth, and, attitude and she asked me to leave the house, I got discouraged and went and took 400 mg of trazodone . I know it was wrong but, I don't like it when she looks at me as a kid.  Patient denies a hx of suicide attempt. Denies substance use. Reports she is taking her medications except Concerta .  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 She is taking a break from college.  Patient does admit that she was inappropriate toward her aunt and states I need to work on my attitude, it can be bad sometimes.  Patient reports she is not willing to be admitted to inpatient but will stay overnight.    Considering her recent overdose and her current presentation, we are recommending medical clearance, followed by admission to Observation unit. We will reevaluate in AM to determine disposition.      Flowsheet Row ED from 12/10/2024 in [facility] UC from 11/16/2024 in [facility] UC from 08/18/2024 in [facility] at [facility] RISK CATEGORY High Risk No Risk No Risk    Psychiatric Specialty Exam  Presentation  General Appearance:Appropriate for Environment  Eye Contact:Fair  Speech:Clear and Coherent  Speech Volume:Normal  Handedness:Right   Mood and Affect  Mood: Anxious; Depressed  Affect: Congruent   Thought Process  Thought Processes: Coherent  Descriptions of Associations:Intact  Orientation:Full (Time, Place and Person)  Thought Content:WDL  Diagnosis of Schizophrenia or Schizoaffective disorder in past: No   Hallucinations:None  Ideas of Reference:None  Suicidal Thoughts:Yes, Active With Plan  Homicidal Thoughts:No   Sensorium  Memory: Immediate Fair; Recent Fair; Remote Fair  Judgment: Poor  Insight: Fair   Chartered Certified Accountant: Fair  Attention Span: Fair  Recall: [employer] of Knowledge: Fair  Language: Fair   Psychomotor Activity  Psychomotor Activity: Normal   Assets  Assets: Manufacturing Systems Engineer; Desire for Improvement; Physical Health; Social Support   Sleep  Sleep: Fair  Number of hours: No data recorded  Physical Exam: Physical Exam Vitals and nursing note reviewed.  Constitutional:      Appearance: Normal appearance.  HENT:  Head: Normocephalic and atraumatic.     Right Ear: Tympanic membrane normal.     Left Ear: Tympanic membrane normal.     Nose: Nose normal.     Mouth/Throat:     Mouth: Mucous membranes are dry.  Eyes:     Extraocular Movements: Extraocular movements intact.     Pupils: Pupils are  equal, round, and reactive to light.  Cardiovascular:     Rate and Rhythm: Normal rate.     Pulses: Normal pulses.  Pulmonary:     Effort: Pulmonary effort is normal.  Neurological:     General: No focal deficit present.     Mental Status: She is alert and oriented to person, place, and time.    Review of Systems  Constitutional: Negative.   HENT: Negative.    Eyes: Negative.   Respiratory: Negative.    Cardiovascular: Negative.   Gastrointestinal: Negative.   Genitourinary: Negative.   Musculoskeletal: Negative.   Skin: Negative.   Neurological: Negative.   Psychiatric/Behavioral:  Positive for depression and suicidal ideas. The patient is nervous/anxious.    Blood pressure 109/77, pulse 88, temperature 97.8 F (36.6 C), temperature source Oral, resp. rate 19, SpO2 99%. There is no height or weight on file to calculate BMI.  Musculoskeletal: Strength & Muscle Tone: within normal limits Gait & Station: normal Patient leans: N/A   [facility] MSE Discharge Disposition for Follow up and Recommendations: We recommend medical clearance following a recent  overdose.    Randall Bouquet, NP 12/10/2024, 4:49 PM

## 2024-12-10 NOTE — ED Notes (Signed)
 Patient will be transferred to [facility] ED for continuity of care

## 2024-12-10 NOTE — ED Provider Notes (Signed)
 " [place] EMERGENCY DEPARTMENT AT [facility] Provider Note   CSN: [id number] Arrival date & time: 12/10/24  1740     Patient presents with: Drug Overdose and medical screening   Jenna Santos is a 18 y.o. female.   Patient with history of ADHD, obesity, bipolar, depression presents today from [facility] with complaints of overdose. Reports that she lives with her aunt and today they got into an argument and she took 400 mg of trazodone  as a suicide attempt. Reports that this happened around 1:30 pm this afternoon. She originally went to [facility] but was sent here for medical clearance. Reports that she has been drowsy since the overdose but denies any other symptoms. Does report that she vomited once but is no longer nauseous. She reports she is no longer suicidal. She is here voluntarily. Denies history of suicide attempts previously. Denies HI or AVH.   The history is provided by the patient. No language interpreter was used.  Drug Overdose       Prior to Admission medications  Medication Sig Start Date End Date Taking? Authorizing Provider  albuterol  (VENTOLIN  HFA) 108 (90 Base) MCG/ACT inhaler Inhale 1-2 puffs into the lungs every 6 (six) hours as needed for wheezing or shortness of breath. 03/29/24   Gladis Elsie BROCKS, PA-C  Carbinoxamine  Maleate ER 4 MG/5ML SUER Take 10 mLs by mouth at bedtime as needed. Patient not taking: Reported on 09/21/2024 04/05/24   Belenda Macario HERO, NP  clobetasol  (TEMOVATE ) 0.05 % external solution Apply 1 Application topically daily. Apply to scalp daily 08/09/24   Alm Delon SAILOR, DO  EPINEPHrine  (EPIPEN  2-PAK) 0.3 mg/0.3 mL IJ SOAJ injection Inject 0.3 mg into the muscle as needed for anaphylaxis. 09/12/23   Belenda Macario HERO, NP  fluconazole  (DIFLUCAN ) 150 MG tablet Take 1 tab po every 3 days 11/16/24   Andra Krabbe C, PA-C  Fluocinolone  Acetonide Scalp (DERMA-SMOOTHE /FS SCALP) 0.01 % OIL Apply 1 application  topically as directed. Apply to  scalp a couple hours before shampooing. 08/09/24   Alm Delon SAILOR, DO  fluticasone  (FLONASE ) 50 MCG/ACT nasal spray Place 2 sprays into both nostrils daily. 03/21/24 09/21/24  Rothstein, Chloe E, NP  gabapentin  (NEURONTIN ) 100 MG tablet Take 1 tablet every night 05/14/24   Georjean Darice HERO, MD  lamoTRIgine (LAMICTAL) 150 MG tablet Take 150 mg by mouth at bedtime. 01/06/24   [provider]  levocetirizine (XYZAL ) 5 MG tablet Take 1 tablet (5 mg total) by mouth daily. 04/05/24 09/21/24  Belenda Macario HERO, NP  levonorgestrel  (MIRENA ) 20 MCG/DAY IUD 1 each by Intrauterine route once.    [provider]  methylphenidate  36 MG PO CR tablet Take 1 tablet (36 mg total) by mouth daily with breakfast. 02/21/24   Joshua Bari HERO, NP  metroNIDAZOLE  (METROGEL ) 0.75 % vaginal gel Place 1 Applicatorful vaginally 2 (two) times daily. Patient not taking: Reported on 09/21/2024 06/02/24   Odell Balls, PA-C  naproxen  (NAPROSYN ) 500 MG tablet Take 1 tablet (500 mg total) by mouth 2 (two) times daily with a meal. As needed for pain 09/21/24   Nicholaus, Devon E, PA-C  ondansetron  (ZOFRAN -ODT) 4 MG disintegrating tablet Take 1 tablet (4 mg total) by mouth every 8 (eight) hours as needed for nausea. 02/08/24   Hoy Nidia FALCON, PA-C  semaglutide -weight management (WEGOVY ) 1.7 MG/0.75ML SOAJ SQ injection Inject 1.7 mg into the skin once a week. 08/24/24   Motwani, Komal, MD  traZODone  (DESYREL ) 50 MG tablet Take 50-100  mg by mouth at bedtime as needed. 04/12/24   [provider]  VRAYLAR 1.5 MG capsule Take 1.5 mg by mouth daily. 04/20/24   [provider]    Allergies: Banana    Review of Systems  All other systems reviewed and are negative.   Updated Vital Signs BP 116/73   Pulse 84   Temp 97.9 F (36.6 C) (Oral)   Resp 16   Ht 5' 2.5 (1.588 m)   Wt 95.9 kg   SpO2 100%   BMI 38.05 kg/m   Physical Exam Vitals and nursing note reviewed.  Constitutional:      General: She is not  in acute distress.    Appearance: Normal appearance. She is normal weight. She is not ill-appearing, toxic-appearing or diaphoretic.  HENT:     Head: Normocephalic and atraumatic.  Eyes:     Extraocular Movements: Extraocular movements intact.     Pupils: Pupils are equal, round, and reactive to light.  Cardiovascular:     Rate and Rhythm: Normal rate and regular rhythm.     Heart sounds: Normal heart sounds.  Pulmonary:     Effort: Pulmonary effort is normal. No respiratory distress.     Breath sounds: Normal breath sounds.  Abdominal:     General: Abdomen is flat.     Palpations: Abdomen is soft.     Tenderness: There is no abdominal tenderness.  Musculoskeletal:        General: Normal range of motion.     Cervical back: Normal range of motion.  Skin:    General: Skin is warm and dry.  Neurological:     General: No focal deficit present.     Mental Status: She is alert and oriented to person, place, and time.  Psychiatric:        Mood and Affect: Mood normal.        Behavior: Behavior normal.     (all labs ordered are listed, but only abnormal results are displayed) Labs Reviewed  CBC - Abnormal; Notable for the following components:      Result Value   RBC 5.85 (*)    MCV 75.2 (*)    MCH 22.9 (*)    All other components within normal limits  SALICYLATE LEVEL - Abnormal; Notable for the following components:   Salicylate Lvl <7.0 (*)    All other components within normal limits  ACETAMINOPHEN  LEVEL - Abnormal; Notable for the following components:   Acetaminophen  (Tylenol ), Serum <10 (*)    All other components within normal limits  COMPREHENSIVE METABOLIC PANEL WITH GFR  ETHANOL  HCG, SERUM, QUALITATIVE  URINE DRUG SCREEN  CBG MONITORING, ED    EKG: EKG Interpretation Date/Time:  Monday December 10 2024 18:39:30 EST Ventricular Rate:  91 PR Interval:  152 QRS Duration:  70 QT Interval:  364 QTC Calculation: 447 R Axis:   58  Text Interpretation: Normal  sinus rhythm with sinus arrhythmia Cannot rule out Anterior infarct , age undetermined Abnormal ECG When compared with ECG of 09-Apr-2024 11:13, PREVIOUS ECG IS PRESENT Confirmed by Laurice Coy [phone]) on 12/10/2024 6:47:57 PM  Radiology: No results found.   Procedures   Medications Ordered in the ED - No data to display                                  Medical Decision Making Amount and/or Complexity of Data Reviewed Labs: ordered.  This patient is a 18 y.o. female who presents to the ED for concern of trazodone  overdose  Past Medical History / Co-morbidities / Social History: history of ADHD, obesity, bipolar, depression  Additional history: Chart reviewed. Pertinent results include: sent from [facility] for medical clearance, looks like plan was admit to observation unit after medically cleared.   Physical Exam: Physical exam performed. The pertinent findings include: Well-appearing, alert and oriented, no physical exam abnormalities.  Lab Tests: I ordered, and personally interpreted labs.  The pertinent results include:  no acute laboratory abnormalities.  UDS negative  Cardiac Monitoring:  The patient was maintained on a cardiac monitor.  My attending physician Dr. Laurice viewed and interpreted the cardiac monitored which showed an underlying rhythm of: sinus rhythm, normal QRS and QTc. I agree with this interpretation.  Consultations Obtained: I requested consultation with the [place] poison control,  and discussed lab and imaging findings as well as pertinent plan - they recommend: monitoring until 6 hours post ingestion, EKG to assess for QRS widening and QTc prolongation, tylenol  level.   Disposition: After consideration of the diagnostic results and the patients response to treatment, I feel that patient is at 6 hour mark post ingestion, continues to be doing well. Plan for TTS consult which has been placed, appreciate their recommendations.   Final diagnoses:   Intentional overdose, initial encounter [facility])    ED Discharge Orders     None          Nora Lauraine DELENA DEVONNA 12/10/24 2002    Laurice Maude BROCKS, MD 12/10/24 2033  "

## 2024-12-10 NOTE — ED Notes (Signed)
 After reviewing chart from [facility] pt took medication around 1-1:15 pm.

## 2024-12-10 NOTE — ED Triage Notes (Signed)
 BIB EMS from [facility], pt took 8, 50mg  trazodone  around 1400 today. Pt states she was trying to hurt herself at this time, but now does not wish she was dead. Denies HI. Pt has been at [facility] for hours and just sent over here for medical screening exam.

## 2024-12-10 NOTE — ED Notes (Signed)
 Spoke with Brittany from poison control, she stated she was going to close out the pt and stated she does not feel her condition will change.

## 2024-12-11 ENCOUNTER — Encounter (HOSPITAL_COMMUNITY): Payer: Self-pay

## 2024-12-11 ENCOUNTER — Inpatient Hospital Stay (HOSPITAL_COMMUNITY)
Admission: AD | Admit: 2024-12-11 | Discharge: 2024-12-14 | DRG: 885 | Disposition: A | Discharge status: Home / Self Care | Source: Intra-hospital | Attending: Student in an Organized Health Care Education/Training Program | Admitting: Student in an Organized Health Care Education/Training Program

## 2024-12-11 DIAGNOSIS — Z833 Family history of diabetes mellitus: Secondary | ICD-10-CM

## 2024-12-11 DIAGNOSIS — Z800 Family history of malignant neoplasm of digestive organs: Secondary | ICD-10-CM

## 2024-12-11 DIAGNOSIS — Z811 Family history of alcohol abuse and dependence: Secondary | ICD-10-CM | POA: Diagnosis not present

## 2024-12-11 DIAGNOSIS — F319 Bipolar disorder, unspecified: Secondary | ICD-10-CM | POA: Diagnosis not present

## 2024-12-11 DIAGNOSIS — J45909 Unspecified asthma, uncomplicated: Secondary | ICD-10-CM | POA: Diagnosis present

## 2024-12-11 DIAGNOSIS — F332 Major depressive disorder, recurrent severe without psychotic features: Secondary | ICD-10-CM | POA: Insufficient documentation

## 2024-12-11 DIAGNOSIS — E669 Obesity, unspecified: Secondary | ICD-10-CM | POA: Diagnosis present

## 2024-12-11 DIAGNOSIS — Z8261 Family history of arthritis: Secondary | ICD-10-CM

## 2024-12-11 DIAGNOSIS — Z823 Family history of stroke: Secondary | ICD-10-CM

## 2024-12-11 DIAGNOSIS — Z79899 Other long term (current) drug therapy: Secondary | ICD-10-CM | POA: Diagnosis not present

## 2024-12-11 DIAGNOSIS — Z8249 Family history of ischemic heart disease and other diseases of the circulatory system: Secondary | ICD-10-CM

## 2024-12-11 DIAGNOSIS — F3130 Bipolar disorder, current episode depressed, mild or moderate severity, unspecified: Principal | ICD-10-CM | POA: Diagnosis present

## 2024-12-11 DIAGNOSIS — Z91018 Allergy to other foods: Secondary | ICD-10-CM

## 2024-12-11 DIAGNOSIS — Z813 Family history of other psychoactive substance abuse and dependence: Secondary | ICD-10-CM

## 2024-12-11 DIAGNOSIS — Z83438 Family history of other disorder of lipoprotein metabolism and other lipidemia: Secondary | ICD-10-CM

## 2024-12-11 DIAGNOSIS — Z6838 Body mass index (BMI) 38.0-38.9, adult: Secondary | ICD-10-CM

## 2024-12-11 DIAGNOSIS — Z7985 Long-term (current) use of injectable non-insulin antidiabetic drugs: Secondary | ICD-10-CM

## 2024-12-11 DIAGNOSIS — F902 Attention-deficit hyperactivity disorder, combined type: Secondary | ICD-10-CM

## 2024-12-11 DIAGNOSIS — F419 Anxiety disorder, unspecified: Secondary | ICD-10-CM | POA: Diagnosis present

## 2024-12-11 DIAGNOSIS — Z9152 Personal history of nonsuicidal self-harm: Secondary | ICD-10-CM

## 2024-12-11 DIAGNOSIS — Z91148 Patient's other noncompliance with medication regimen for other reason: Secondary | ICD-10-CM | POA: Diagnosis not present

## 2024-12-11 MED ORDER — TRAZODONE HCL 50 MG PO TABS
50.0000 mg | ORAL_TABLET | Freq: Every evening | ORAL | Status: DC | PRN
  Administered 2024-12-11 – 2024-12-13 (×3): 50 mg via ORAL
  Filled 2024-12-11 (×3): qty 1

## 2024-12-11 MED ORDER — DIPHENHYDRAMINE HCL 50 MG/ML IJ SOLN: 50.0000 mg | Freq: Three times a day (TID) | INTRAMUSCULAR | Status: DC | PRN

## 2024-12-11 MED ORDER — ACETAMINOPHEN 325 MG PO TABS: 650.0000 mg | ORAL_TABLET | Freq: Four times a day (QID) | ORAL | Status: DC | PRN

## 2024-12-11 MED ORDER — ALUM & MAG HYDROXIDE-SIMETH 200-200-20 MG/5ML PO SUSP: 30.0000 mL | Freq: Four times a day (QID) | ORAL | Status: DC | PRN

## 2024-12-11 MED ORDER — LAMOTRIGINE 25 MG PO TABS: 25.0000 mg | ORAL_TABLET | Freq: Every day | ORAL | Status: DC

## 2024-12-11 MED ORDER — HALOPERIDOL LACTATE 5 MG/ML IJ SOLN: 5.0000 mg | Freq: Three times a day (TID) | INTRAMUSCULAR | Status: DC | PRN

## 2024-12-11 MED ORDER — HALOPERIDOL 5 MG PO TABS: 5.0000 mg | ORAL_TABLET | Freq: Three times a day (TID) | ORAL | Status: DC | PRN

## 2024-12-11 MED ORDER — ALUM & MAG HYDROXIDE-SIMETH 200-200-20 MG/5ML PO SUSP: 30.0000 mL | ORAL | Status: DC | PRN

## 2024-12-11 MED ORDER — HYDROXYZINE HCL 25 MG PO TABS
25.0000 mg | ORAL_TABLET | Freq: Three times a day (TID) | ORAL | Status: DC | PRN
  Administered 2024-12-11 – 2024-12-13 (×3): 25 mg via ORAL
  Filled 2024-12-11 (×2): qty 1

## 2024-12-11 MED ORDER — DIPHENHYDRAMINE HCL 25 MG PO CAPS: 50.0000 mg | ORAL_CAPSULE | Freq: Three times a day (TID) | ORAL | Status: DC | PRN

## 2024-12-11 MED ORDER — HALOPERIDOL LACTATE 5 MG/ML IJ SOLN: 10.0000 mg | Freq: Three times a day (TID) | INTRAMUSCULAR | Status: DC | PRN

## 2024-12-11 MED ORDER — GABAPENTIN 100 MG PO CAPS
100.0000 mg | ORAL_CAPSULE | Freq: Every day | ORAL | Status: DC
  Administered 2024-12-11: 100 mg via ORAL
  Filled 2024-12-11: qty 1

## 2024-12-11 MED ORDER — MAGNESIUM HYDROXIDE 400 MG/5ML PO SUSP: 30.0000 mL | Freq: Every day | ORAL | Status: DC | PRN

## 2024-12-11 MED ORDER — HYDROXYZINE HCL 25 MG PO TABS: 25.0000 mg | ORAL_TABLET | Freq: Three times a day (TID) | ORAL | Status: DC | PRN

## 2024-12-11 MED ORDER — MIRTAZAPINE 7.5 MG PO TABS: 15.0000 mg | ORAL_TABLET | Freq: Every day | ORAL | Status: DC

## 2024-12-11 MED ORDER — LORAZEPAM 2 MG/ML IJ SOLN: 2.0000 mg | Freq: Three times a day (TID) | INTRAMUSCULAR | Status: DC | PRN

## 2024-12-11 MED ORDER — CARIPRAZINE HCL 1.5 MG PO CAPS: 1.5000 mg | ORAL_CAPSULE | Freq: Every day | ORAL | Status: DC

## 2024-12-11 MED ORDER — MAGNESIUM HYDROXIDE 400 MG/5ML PO SUSP: 15.0000 mL | Freq: Every day | ORAL | Status: DC | PRN

## 2024-12-11 NOTE — Progress Notes (Signed)
 Ronnald CHRISTELLA Boyer, RN, 12/11/2024, Time of arrival: 72  Patient is a new admit to unit. Patient is voluntary. Patient belongings addressed and stored. Skin check performed with two staff, results unremarkable. Vital signs unremarkable. No reported or observed physiological concerns or abnormalities. Patient engaged with assessment with encouragement. Patient orientated to facility, unit and room. All questions and concerns addressed at this time.  Patient stated reason for admission/reason for being here: Patient overdosed on Trazodone   Patient current presentation remarkable for: Depressed, anxious and cooperative  Patient reports having conflict with her aunt whom told her she had to move out. Patient reports she regrets taking the pills and it was a impulsive decision. Patient denies SI at this time.

## 2024-12-11 NOTE — Tx Team (Signed)
 Initial Treatment Plan 12/11/2024 5:58 PM [name] FMW:[id number]    PATIENT STRESSORS: Marital or family conflict     PATIENT STRENGTHS: Ability for insight  Motivation for treatment/growth  Supportive family/friends  Work skills    PATIENT IDENTIFIED PROBLEMS: Overdose on Trazodone                      DISCHARGE CRITERIA:  Adequate post-discharge living arrangements Improved stabilization in mood, thinking, and/or behavior Motivation to continue treatment in a less acute level of care  PRELIMINARY DISCHARGE PLAN: Attend aftercare/continuing care group Return to previous work or school arrangements  PATIENT/FAMILY INVOLVEMENT: This treatment plan has been presented to and reviewed with the patient, Jenna Santos.  The patient has been given the opportunity to ask questions and make suggestions.  Ronnald CHRISTELLA Boyer, RN 12/11/2024, 5:58 PM

## 2024-12-11 NOTE — [facility] Group Notes (Signed)
 [facility] Group Notes:  (Nursing/MHT/Case Management/Adjunct)  Date:  12/11/2024  Time:  2000  Type of Therapy:  Wrap up group  Participation Level:  Active  Participation Quality:  Appropriate, Attentive, Sharing, and Supportive  Affect:  Appropriate  Cognitive:  Alert  Insight:  Improving  Engagement in Group:  Engaged  Modes of Intervention:  Clarification, Education, and Socialization  Summary of Progress/Problems: Positive thinking and self-care were discussed.   Lenora Manuelita RAMAN 12/11/2024, 9:16 PM

## 2024-12-11 NOTE — Group Note (Deleted)
 Date:  12/11/2024 Time:  4:38 PM  Group Topic/Focus:  Emotional Education:   The focus of this group is to discuss what feelings/emotions are, and how they are experienced. Wellness Toolbox:   The focus of this group is to discuss various aspects of wellness, balancing those aspects and exploring ways to increase the ability to experience wellness.  Patients will create a wellness toolbox for use upon discharge.     Participation Level:  {[facility] PARTICIPATION OZCZO:77735}  Participation Quality:  {[facility] PARTICIPATION QUALITY:22265}  Affect:  {[facility]HH AFFECT:22266}  Cognitive:  {[facility] COGNITIVE:22267}  Insight: {BHH Insight2:20797}  Engagement in Group:  {B[facility] ENGAGEMENT IN HMNLE:77731}  Modes of Intervention:  {BHH MODES OF INTERVENTION:22269}  Additional Comments:  ***  Amandeep Nesmith N Marvena Tally 12/11/2024, 4:38 PM

## 2024-12-11 NOTE — ED Notes (Signed)
 Patient is alert and oriented. Patient calm and cooperative.  Guarded and quiet with staff.
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
## 2024-12-11 NOTE — ED Provider Notes (Signed)
 Emergency Medicine Observation Re-evaluation Note  Jenna Santos is a 18 y.o. female, seen on rounds today.  Pt initially presented to the ED for complaints of Drug Overdose and medical screening Currently, the patient is leaping.  Physical Exam  BP 101/63 (BP Location: Right Arm)   Pulse 92   Temp 99 F (37.2 C) (Oral)   Resp 15   Ht 5' 2.5 (1.588 m)   Wt 95.9 kg   SpO2 99%   BMI 38.05 kg/m  Physical Exam General: No acute distress Cardiac: Well-perfused Lungs: Nonlabored Psych: Unable to assess  ED Course / MDM  EKG:EKG Interpretation Date/Time:  Monday December 10 2024 18:39:30 EST Ventricular Rate:  91 PR Interval:  152 QRS Duration:  70 QT Interval:  364 QTC Calculation: 447 R Axis:   58  Text Interpretation: Normal sinus rhythm with sinus arrhythmia Cannot rule out Anterior infarct , age undetermined Abnormal ECG When compared with ECG of 09-Apr-2024 11:13, PREVIOUS ECG IS PRESENT Confirmed by Laurice Coy [phone]) on 12/10/2024 6:47:57 PM  I have reviewed the labs performed to date as well as medications administered while in observation.  Recent changes in the last 24 hours include cleared by poison control..  Plan  Current plan is for psychiatry reevaluation.    Towana Ozell BROCKS, MD 12/11/24 [phone]

## 2024-12-11 NOTE — Group Note (Signed)
 Date:  12/11/2024 Time:  5:59 PM  Group Topic/Focus:  Overcoming Stress:   The focus of this group is to define stress and help patients assess their triggers. Stress Management    Participation Level:  Did Not Attend    Jenna Santos 12/11/2024, 5:59 PM

## 2024-12-11 NOTE — ED Notes (Signed)
 Pt has been sleeping since she was placed in TCU.

## 2024-12-11 NOTE — Plan of Care (Signed)
   Problem: Education: Goal: Knowledge of [place] [facility] Education information/materials will improve Outcome: Progressing Goal: Emotional status will improve Outcome: Progressing Goal: Mental status will improve Outcome: Progressing Goal: Verbalization of understanding the information provided will improve Outcome: Progressing

## 2024-12-11 NOTE — ED Notes (Signed)
 Patient off unit to [facility] per provider. Patient alert, calm, cooperative, no s/s of distress at time of discharge.  Discharge information and belongings given to [employer]. Patient ambulatory off unit, escorted by RN. Patient transported by [employer].

## 2024-12-12 ENCOUNTER — Encounter (HOSPITAL_COMMUNITY): Payer: Self-pay

## 2024-12-12 DIAGNOSIS — F319 Bipolar disorder, unspecified: Secondary | ICD-10-CM | POA: Diagnosis not present

## 2024-12-12 LAB — LIPID PANEL
Cholesterol: 161 mg/dL (ref 0–200)
HDL: 59 mg/dL
LDL Cholesterol: 94 mg/dL (ref 0–99)
Total CHOL/HDL Ratio: 2.7 ratio
Triglycerides: 37 mg/dL
VLDL: 7 mg/dL (ref 0–40)

## 2024-12-12 LAB — COMPREHENSIVE METABOLIC PANEL WITH GFR
ALT: 10 U/L (ref 0–44)
AST: 16 U/L (ref 15–41)
Albumin: 4 g/dL (ref 3.5–5.0)
Alkaline Phosphatase: 78 U/L (ref 38–126)
Anion gap: 13 (ref 5–15)
BUN: 16 mg/dL (ref 6–20)
CO2: 22 mmol/L (ref 22–32)
Calcium: 9.4 mg/dL (ref 8.9–10.3)
Chloride: 104 mmol/L (ref 98–111)
Creatinine, Ser: 0.96 mg/dL (ref 0.44–1.00)
GFR, Estimated: >60 mL/min
Glucose, Bld: 96 mg/dL (ref 70–99)
Potassium: 4.1 mmol/L (ref 3.5–5.1)
Sodium: 139 mmol/L (ref 135–145)
Total Bilirubin: 0.5 mg/dL (ref 0.0–1.2)
Total Protein: 7.1 g/dL (ref 6.5–8.1)

## 2024-12-12 LAB — HEMOGLOBIN A1C
Hgb A1c MFr Bld: 5.4 % (ref 4.8–5.6)
Mean Plasma Glucose: 108.28 mg/dL

## 2024-12-12 LAB — CBC
HCT: 40.9 % (ref 36.0–46.0)
Hemoglobin: 12.7 g/dL (ref 12.0–15.0)
MCH: 23.3 pg — ABNORMAL LOW (ref 26.0–34.0)
MCHC: 31.1 g/dL (ref 30.0–36.0)
MCV: 75.2 fL — ABNORMAL LOW (ref 80.0–100.0)
Platelets: 288 K/uL (ref 150–400)
RBC: 5.44 MIL/uL — ABNORMAL HIGH (ref 3.87–5.11)
RDW: 15.2 % (ref 11.5–15.5)
WBC: 8.1 K/uL (ref 4.0–10.5)
nRBC: 0 % (ref 0.0–0.2)

## 2024-12-12 LAB — TSH: TSH: 2.29 u[IU]/mL (ref 0.350–4.500)

## 2024-12-12 LAB — HCG, SERUM, QUALITATIVE: Preg, Serum: NEGATIVE

## 2024-12-12 MED ORDER — ALBUTEROL SULFATE HFA 108 (90 BASE) MCG/ACT IN AERS: 1.0000 | INHALATION_SPRAY | Freq: Four times a day (QID) | RESPIRATORY_TRACT | Status: DC | PRN

## 2024-12-12 MED ORDER — CARIPRAZINE HCL 1.5 MG PO CAPS
3.0000 mg | ORAL_CAPSULE | Freq: Every day | ORAL | Status: DC
  Administered 2024-12-12 – 2024-12-14 (×3): 3 mg via ORAL
  Filled 2024-12-12 (×3): qty 2

## 2024-12-12 MED ORDER — LAMOTRIGINE 25 MG PO TABS
75.0000 mg | ORAL_TABLET | Freq: Every day | ORAL | Status: DC
  Administered 2024-12-12 – 2024-12-14 (×3): 75 mg via ORAL
  Filled 2024-12-12 (×3): qty 3

## 2024-12-12 NOTE — Plan of Care (Signed)
   Problem: Education: Goal: Knowledge of [facility] Education information/materials will improve Outcome: Progressing Goal: Emotional status will improve Outcome: Progressing Goal: Mental status will improve Outcome: Progressing Goal: Verbalization of understanding the information provided will improve Outcome: Progressing   Problem: Activity: Goal: Interest or engagement in activities will improve Outcome: Progressing Goal: Sleeping patterns will improve Outcome: Progressing   Problem: Coping: Goal: Ability to verbalize frustrations and anger appropriately will improve Outcome: Progressing Goal: Ability to demonstrate self-control will improve Outcome: Progressing

## 2024-12-12 NOTE — Progress Notes (Signed)
(  Sleep Hours) -10  (Any PRNs that were needed, meds refused, or side effects to meds)- hydroxyzine  25mg ; Trazodone  50mg   (Any disturbances and when (visitation, over night)-none  (Concerns raised by the patient)-none   (SI/HI/AVH)-denied

## 2024-12-12 NOTE — [facility] Group Notes (Signed)
 Adult Psychoeducational Group Note  Date:  12/12/2024 Time:  8:38 PM  Group Topic/Focus:  Wrap-Up Group:   The focus of this group is to help patients review their daily goal of treatment and discuss progress on daily workbooks.  Participation Level:  Active  Participation Quality:  Appropriate  Affect:  Appropriate  Cognitive:  Appropriate  Insight: Appropriate  Engagement in Group:  Engaged  Modes of Intervention:  Discussion  Additional Comments:  aubery said her day was a 9. Goal for today to get self better mentally. Coping reminding self to God is with me. Favorite part of the day getting to talk to family  Lang Donia Law 12/12/2024, 8:38 PM

## 2024-12-12 NOTE — Group Note (Signed)
 Recreation Therapy Group Note   Group Topic:Stress Management  Group Date: 12/12/2024 Start Time: 0930 End Time: [id number] Facilitators: Daruis Swaim-McCall, LRT,CTRS Location: 300 Hall Dayroom   Group Topic: Stress Management  Goal Area(s) Addresses:  Patient will identify positive stress management techniques. Patient will identify benefits of using stress management post d/c.  Behavioral Response:   Intervention: Let's Meditate App  Activity: Turning the Page. LRT played a meditation that focused on reflecting on the year that's passed and planting seeds of intention for the beginning of the new year.  The meditation also wanted patients to focus on not expecting changes to happen immediately but to treat each moment like taking a breath.    Education:  Stress Management, Discharge Planning.   Education Outcome: Acknowledges Education   Affect/Mood: N/A   Participation Level: Did not attend    Clinical Observations/Individualized Feedback:      Plan: Continue to engage patient in RT group sessions 2-3x/week.   Jenna Santos, LRT,CTRS 12/12/2024 12:57 PM

## 2024-12-12 NOTE — Group Note (Deleted)
 Date:  12/12/2024 Time:  4:46 PM  Group Topic/Focus:  Building Self Esteem:   The Focus of this group is helping patients become aware of the effects of self-esteem on their lives, the things they and others do that enhance or undermine their self-esteem, seeing the relationship between their level of self-esteem and the choices they make and learning ways to enhance self-esteem. Conflict Resolution:   The focus of this group is to discuss the conflict resolution process and how it may be used upon discharge. Healthy Communication:   The focus of this group is to discuss communication, barriers to communication, as well as healthy ways to communicate with others.    Participation Level:  Active  Participation Quality:  Appropriate  Affect:  Appropriate  Cognitive:  Alert  Insight: Appropriate  Engagement in Group:  Engaged  Modes of Intervention:  Discussion   Jenna Santos 12/12/2024, 4:46 PM

## 2024-12-12 NOTE — Progress Notes (Signed)
(  Sleep Hours) -6.5 (Any PRNs that were needed, meds refused, or side effects to meds)- Trazodone  and Atarax  (Any disturbances and when (visitation, over night)-none (Concerns raised by the patient)- none (SI/HI/AVH)-denied

## 2024-12-12 NOTE — Plan of Care (Signed)
   Problem: Education: Goal: Knowledge of [place] [facility] Education information/materials will improve Outcome: Progressing Goal: Emotional status will improve Outcome: Progressing Goal: Mental status will improve Outcome: Progressing
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
## 2024-12-12 NOTE — Group Note (Signed)
 Date:  12/12/2024 Time:  4:35 PM  Group Topic/Focus:  Building Self Esteem:   The Focus of this group is helping patients become aware of the effects of self-esteem on their lives, the things they and others do that enhance or undermine their self-esteem, seeing the relationship between their level of self-esteem and the choices they make and learning ways to enhance self-esteem. Conflict Resolution:   The focus of this group is to discuss the conflict resolution process and how it may be used upon discharge. Healthy Communication:   The focus of this group is to discuss communication, barriers to communication, as well as healthy ways to communicate with others.    Participation Level:  Active  Participation Quality:  Appropriate  Affect:  Appropriate  Cognitive:  Appropriate  Insight: Appropriate  Engagement in Group:  Engaged  Modes of Intervention:  Discussion    Jenna Santos 12/12/2024, 4:35 PM

## 2024-12-12 NOTE — Progress Notes (Signed)
" °   12/12/24 1021  Psychosocial Assessment  Patient Complaints Anxiety  Eye Contact Fair  Facial Expression Anxious  Affect Anxious  Speech Soft  Interaction Cautious  Motor Activity Other (Comment) (WDL)  Appearance/Hygiene In scrubs  Behavior Characteristics Cooperative  Mood Anxious  Thought Process  Coherency WDL  Content WDL  Delusions None reported or observed  Perception WDL  Hallucination None reported or observed  Judgment Limited  Confusion None  Danger to Self  Current suicidal ideation? Denies  Danger to Others  Danger to Others None reported or observed    "

## 2024-12-12 NOTE — Group Note (Signed)
 Date:  12/12/2024 Time:  10:01 AM  Group Topic/Focus: Recreational Therapy Turning the page into a new year    Participation Level:  Did Not Attend   Dolores CHRISTELLA Fredericks 12/12/2024, 10:01 AM

## 2024-12-12 NOTE — Group Note (Signed)
 Date:  12/12/2024 Time:  9:39 AM  Group Topic/Focus: Goal and orientation Goals Group:   The focus of this group is to help patients establish daily goals to achieve during treatment and discuss how the patient can incorporate goal setting into their daily lives to aide in recovery. Orientation:   The focus of this group is to educate the patient on the purpose and policies of crisis stabilization and provide a format to answer questions about their admission.  The group details unit policies and expectations of patients while admitted.    Participation Level:  Active  Participation Quality:  Appropriate  Affect:  Appropriate  Cognitive:  Alert  Insight: Appropriate  Engagement in Group:  Engaged  Modes of Intervention:  Orientation  Additional Comments:    Jenna Santos 12/12/2024, 9:39 AM

## 2024-12-12 NOTE — BH IP Treatment Plan (Signed)
 Interdisciplinary Treatment and Diagnostic Plan Update  12/12/2024 Time of Session: 1005 AM Jenna Santos MRN: [id number]  Principal Diagnosis: MDD (major depressive disorder), recurrent severe, without psychosis (HCC)  Secondary Diagnoses: Principal Problem:   MDD (major depressive disorder), recurrent severe, without psychosis (HCC)   Current Medications:  Current Facility-Administered Medications  Medication Dose Route Frequency Provider Last Rate Last Admin   acetaminophen  (TYLENOL ) tablet 650 mg  650 mg Oral Q6H PRN Nkwenti, Doris, NP       albuterol  (VENTOLIN  HFA) 108 (90 Base) MCG/ACT inhaler 1-2 puff  1-2 puff Inhalation Q6H PRN Pashayan, Alexander S, DO       alum & mag hydroxide-simeth (MAALOX/MYLANTA) 200-200-20 MG/5ML suspension 30 mL  30 mL Oral Q6H PRN Nkwenti, Doris, NP       cariprazine (VRAYLAR) capsule 3 mg  3 mg Oral Daily Pashayan, Alexander S, DO   3 mg at 12/12/24 1021   haloperidol (HALDOL) tablet 5 mg  5 mg Oral TID PRN Tex Drilling, NP       And   diphenhydrAMINE  (BENADRYL ) capsule 50 mg  50 mg Oral TID PRN Tex Drilling, NP       haloperidol lactate (HALDOL) injection 5 mg  5 mg Intramuscular TID PRN Tex Drilling, NP       And   diphenhydrAMINE  (BENADRYL ) injection 50 mg  50 mg Intramuscular TID PRN Tex Drilling, NP       And   LORazepam (ATIVAN) injection 2 mg  2 mg Intramuscular TID PRN Tex Drilling, NP       haloperidol lactate (HALDOL) injection 10 mg  10 mg Intramuscular TID PRN Tex Drilling, NP       And   diphenhydrAMINE  (BENADRYL ) injection 50 mg  50 mg Intramuscular TID PRN Tex Drilling, NP       And   LORazepam (ATIVAN) injection 2 mg  2 mg Intramuscular TID PRN Tex Drilling, NP       hydrOXYzine  (ATARAX ) tablet 25 mg  25 mg Oral TID PRN Tex Drilling, NP   25 mg at 12/11/24 2046   lamoTRIgine (LAMICTAL) tablet 75 mg  75 mg Oral Daily Pashayan, Alexander S, DO   75 mg at 12/12/24 1021   magnesium hydroxide (MILK OF MAGNESIA)  suspension 30 mL  30 mL Oral Daily PRN Tex Drilling, NP       traZODone  (DESYREL ) tablet 50 mg  50 mg Oral QHS PRN Tex Drilling, NP   50 mg at 12/11/24 2046   PTA Medications: Medications Prior to Admission  Medication Sig Dispense Refill Last Dose/Taking   albuterol  (VENTOLIN  HFA) 108 (90 Base) MCG/ACT inhaler Inhale 1-2 puffs into the lungs every 6 (six) hours as needed for wheezing or shortness of breath. 6.7 g 0    EPINEPHrine  (EPIPEN  2-PAK) 0.3 mg/0.3 mL IJ SOAJ injection Inject 0.3 mg into the muscle as needed for anaphylaxis. 2 each 6    hydrOXYzine  (ATARAX ) 25 MG tablet Take 25 mg by mouth daily.      lamoTRIgine (LAMICTAL) 25 MG tablet Take 50 mg by mouth.      levonorgestrel  (MIRENA ) 20 MCG/DAY IUD 1 each by Intrauterine route once.      methylphenidate  36 MG PO CR tablet Take 36 mg by mouth daily.      traZODone  (DESYREL ) 50 MG tablet Take 50-100 mg by mouth at bedtime as needed.      VRAYLAR 1.5 MG capsule Take 1.5 mg by mouth daily.  Patient Stressors: Marital or family conflict    Patient Strengths: Ability for insight  Motivation for treatment/growth  Supportive family/friends  Work skills   Treatment Modalities: Medication Management, Group therapy, Case management,  1 to 1 session with clinician, Psychoeducation, Recreational therapy.   Physician Treatment Plan for Primary Diagnosis: MDD (major depressive disorder), recurrent severe, without psychosis (HCC) Long Term Goal(s): Improvement in symptoms so as ready for discharge   Short Term Goals:    Medication Management: Evaluate patient's response, side effects, and tolerance of medication regimen.  Therapeutic Interventions: 1 to 1 sessions, Unit Group sessions and Medication administration.  Evaluation of Outcomes: Not Progressing  Physician Treatment Plan for Secondary Diagnosis: Principal Problem:   MDD (major depressive disorder), recurrent severe, without psychosis (HCC)  Long Term Goal(s):  Improvement in symptoms so as ready for discharge   Short Term Goals:       Medication Management: Evaluate patient's response, side effects, and tolerance of medication regimen.  Therapeutic Interventions: 1 to 1 sessions, Unit Group sessions and Medication administration.  Evaluation of Outcomes: Not Progressing   RN Treatment Plan for Primary Diagnosis: MDD (major depressive disorder), recurrent severe, without psychosis (HCC) Long Term Goal(s): Knowledge of disease and therapeutic regimen to maintain health will improve  Short Term Goals: Ability to remain free from injury will improve, Ability to verbalize frustration and anger appropriately will improve, Ability to demonstrate self-control, Ability to participate in decision making will improve, Ability to verbalize feelings will improve, Ability to disclose and discuss suicidal ideas, Ability to identify and develop effective coping behaviors will improve, and Compliance with prescribed medications will improve  Medication Management: RN will administer medications as ordered by provider, will assess and evaluate patient's response and provide education to patient for prescribed medication. RN will report any adverse and/or side effects to prescribing provider.  Therapeutic Interventions: 1 on 1 counseling sessions, Psychoeducation, Medication administration, Evaluate responses to treatment, Monitor vital signs and CBGs as ordered, Perform/monitor CIWA, COWS, AIMS and Fall Risk screenings as ordered, Perform wound care treatments as ordered.  Evaluation of Outcomes: Not Progressing   LCSW Treatment Plan for Primary Diagnosis: MDD (major depressive disorder), recurrent severe, without psychosis (HCC) Long Term Goal(s): Safe transition to appropriate next level of care at discharge, Engage patient in therapeutic group addressing interpersonal concerns.  Short Term Goals: Engage patient in aftercare planning with referrals and  resources, Increase social support, Increase ability to appropriately verbalize feelings, Increase emotional regulation, Facilitate acceptance of mental health diagnosis and concerns, Facilitate patient progression through stages of change regarding substance use diagnoses and concerns, Identify triggers associated with mental health/substance abuse issues, and Increase skills for wellness and recovery  Therapeutic Interventions: Assess for all discharge needs, 1 to 1 time with Social worker, Explore available resources and support systems, Assess for adequacy in community support network, Educate family and significant other(s) on suicide prevention, Complete Psychosocial Assessment, Interpersonal group therapy.  Evaluation of Outcomes: Not Progressing   Progress in Treatment: Attending groups: Yes. Participating in groups: Yes. Taking medication as prescribed: Yes. Toleration medication: Yes. Family/Significant other contact made: No, will contact:  consents pending Patient understands diagnosis: Yes. Discussing patient identified problems/goals with staff: Yes. Medical problems stabilized or resolved: Yes. Denies suicidal/homicidal ideation: Yes. Issues/concerns per patient self-inventory: No.  New problem(s) identified: No, Describe:  none  New Short Term/Long Term Goal(s): medication stabilization, elimination of SI thoughts, development of comprehensive mental wellness plan.    Patient Goals:  Get my life back  together, adjust medications  Discharge Plan or Barriers: Patient recently admitted. CSW will continue to follow and assess for appropriate referrals and possible discharge planning.    Reason for Continuation of Hospitalization: Depression Medication stabilization Suicidal ideation  Estimated Length of Stay: 5-7 days  Last 3 [place] Suicide Severity Risk Score: Flowsheet Row [facility] (Current[facility] Most  recent reading at 12/11/2024  3:00 PM ED from 12/10/2024 in [facility] at [facility] Most recent reading at 12/10/2024  6:02 PM ED from 12/10/2024 in [facility] Most recent reading at 12/10/2024  3:25 PM  C-SSRS RISK CATEGORY High Risk High Risk High Risk    Last PHQ 2/9 Scores:    12/10/2024    4:49 PM 09/21/2024    9:28 AM 10/18/2023    9:40 AM  Depression screen PHQ 2/9  Decreased Interest 1 1 1   Down, Depressed, Hopeless 1 1 1   PHQ - 2 Score 2 2 2   Altered sleeping 1 2 2   Tired, decreased energy 1 3 2   Change in appetite 1 1 2   Feeling bad or failure about yourself  1 1 0  Trouble concentrating 1 1 0  Moving slowly or fidgety/restless 1 0 2  Suicidal thoughts 1 0   PHQ-9 Score 9 10  10    Difficult doing work/chores Very difficult       Data saved with a previous flowsheet row definition    Scribe for Treatment Team: Jenkins LULLA Primer, ISRA[place] 12/12/2024 11:36 AM

## 2024-12-12 NOTE — Group Note (Signed)
 Date:  12/12/2024 Time:  4:01 PM  Group Topic/Focus:Pharmacy group Diagnosis Education:   The focus of this group is to discuss the major disorders that patients maybe diagnosed with.  Group discusses the importance of knowing what one's diagnosis is so that one can understand treatment and better advocate for oneself.    Participation Level:  Active   Jenna Santos 12/12/2024, 4:01 PM

## 2024-12-12 NOTE — [facility] Suicide Risk Assessment (Signed)
 [facility] Admission Suicide Risk Assessment   Nursing information obtained from:  Patient Demographic factors:  Adolescent or young adult Current Mental Status:  NA Loss Factors:  Loss of significant relationship Historical Factors:  Impulsivity Risk Reduction Factors:  Employed, Positive social support  Total Time spent with patient: 1 hour Principal Problem: Bipolar 1 disorder, depressed (HCC) Diagnosis:  Principal Problem:   Bipolar 1 disorder, depressed (HCC)  Subjective Data:  Jenna Santos is a 18 yr old female who presented on 12/29 to [facility] after a Suicide Attempt (OD 400 mg of Trazodone ), she was admitted to [facility] on 12/31.  PPHx is significant for Bipolar Disorder, Anxiety, ADHD, and a remote history of Self Injurious Behavior (Cutting- middle school), and no history of Prior Suicide Attempts or Psychiatric Hospitalizations.   When asked what led to her hospitalization she reports that Monday she got into an argument with her mentor.  She reports that her mentor said things that were hurtful and this triggered her and she just became overwhelmed.  She reports that due to this she impulsively took her trazodone  and that she did not plan this nor has she ever planned it.  She reports after she did so she told her mental what happened who then called the ambulance.  She reports they have had arguments like this in the past but she has always been able to cope but this was just the final straw.  She reports that Pardy issue is being treated as a child and not a young adult.   She reports past psychiatric history significant for bipolar disorder, anxiety, and ADHD.  She reports no history of prior suicide attempt.  She reports a remote history of self-injurious behavior-cutting in middle school.  She reports no prior psychiatric hospitalizations.  She reports past medical history significant for vascular migraines and PCOS.  She reports past surgical history significant for wisdom tooth removal  x2.  She is a concussion in 2023 when she was hit in the head with a volleyball at school.  She reports no history of seizures.  She reports NKDA.   She reports she had been living with her mentor but that she has already made arrangements to live with her god sister once she is discharged from the hospital.  She reports she graduated high school and has some college.  She reports she does hope to go back to school to study psychology and become a warehouse manager.  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  She reports no alcohol use.  She reports no tobacco use.  He reports no illicit substance use.  She reports no current legal issues.  She reports no access to firearms.   Past her about the effectiveness of her medications.  She reports that in the past when she was on the higher dose of Lamictal this was effective and that her outpatient psychiatrist had been working on titrating her back up.  She reports she has been on her current dose of 50 mg for about 2 weeks and is agreeable to increasing it.  She reports that Vraylar has been helpful but feels like it would be more effective at a higher dose and is agreeable to increasing it.  Continued Clinical Symptoms:  Alcohol Use Disorder Identification Test Final Score (AUDIT): 0 The Alcohol Use Disorders Identification Test, Guidelines for Use in Primary Care, Second Edition.  World Science Writer [facility]). Score between  0-7:  no or low risk or alcohol related problems. Score between 8-15:  moderate risk of alcohol related problems. Score between 16-19:  high risk of alcohol related problems. Score 20 or above:  warrants further diagnostic evaluation for alcohol dependence and treatment.   CLINICAL FACTORS:   Bipolar Disorder:   Depressive phase More than one psychiatric diagnosis Previous Psychiatric Diagnoses and Treatments   Musculoskeletal: Strength &  Muscle Tone: within normal limits Gait & Station: normal Patient leans: N/A  Psychiatric Specialty Exam:  Presentation  General Appearance:  Appropriate for Environment; Casual  Eye Contact: Good  Speech: Clear and Coherent  Speech Volume: Normal  Handedness: Right   Mood and Affect  Mood: Dysphoric  Affect: Congruent   Thought Process  Thought Processes: Coherent; Goal Directed  Descriptions of Associations:Intact  Orientation:Full (Time, Place and Person)  Thought Content:Logical; WDL  History of Schizophrenia/Schizoaffective disorder:No  Duration of Psychotic Symptoms:No data recorded Hallucinations:Hallucinations: None  Ideas of Reference:None  Suicidal Thoughts:Suicidal Thoughts: No  Homicidal Thoughts:Homicidal Thoughts: No   Sensorium  Memory: Immediate Fair; Recent Fair  Judgment: Intact  Insight: Present   Executive Functions  Concentration: Fair  Attention Span: Fair  Recall: [employer] of Knowledge: Fair  Language: Fair   Psychomotor Activity  Psychomotor Activity:Psychomotor Activity: Normal   Assets  Assets: Communication Skills; Desire for Improvement; Physical Health; Resilience; Social Support   Sleep  Sleep:Sleep: Good    Physical Exam: Physical Exam Vitals and nursing note reviewed.  Constitutional:      General: She is not in acute distress.    Appearance: Normal appearance. She is normal weight. She is not ill-appearing or toxic-appearing.  HENT:     Head: Normocephalic and atraumatic.  Pulmonary:     Effort: Pulmonary effort is normal.  Musculoskeletal:        General: Normal range of motion.  Neurological:     General: No focal deficit present.     Mental Status: She is alert.    Review of Systems  Respiratory:  Negative for cough and shortness of breath.   Cardiovascular:  Negative for chest pain.  Gastrointestinal:  Negative for abdominal pain, constipation, diarrhea, nausea and  vomiting.  Neurological:  Negative for dizziness, weakness and headaches.  Psychiatric/Behavioral:  Positive for depression (mild). Negative for hallucinations and suicidal ideas. The patient is nervous/anxious (mild).    Blood pressure 116/71, pulse 93, temperature 99 F (37.2 C), temperature source Oral, resp. rate 20, height 5' 2.5 (1.588 m), weight 97.3 kg, SpO2 100%. Body mass index is 38.63 kg/m.   COGNITIVE FEATURES THAT CONTRIBUTE TO RISK:  None    SUICIDE RISK:   Moderate:  Frequent suicidal ideation with limited intensity, and duration, some specificity in terms of plans, no associated intent, good self-control, limited dysphoria/symptomatology, some risk factors present, and identifiable protective factors, including available and accessible social support.  PLAN OF CARE:  Merilee L. Taitt is a 18 yr old female who presented on 12/29 to [facility] after a Suicide Attempt (OD 400 mg of Trazodone ), she was admitted to [facility] on 12/31.  PPHx is significant for Bipolar Disorder, Anxiety, ADHD, and a remote history of Self Injurious Behavior (Cutting- middle school), and no history of Prior Suicide Attempts or Psychiatric Hospitalizations.     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  She reports that Lamictal and Vraylar are  helpful so we will increase her Vraylar and continue with the titration of Lamictal.       Bipolar Disorder: -Increase Vraylar to 3 mg daily for mood stability -Increase Lamictal to 75 mg daily for mood stability -Continue Agitation Protocol: Haldol/Ativan/Benadryl      Asthma: -Continue Albuterol  1-2 puffs q6 PRN      -Continue PRN's: Tylenol , Maalox, Atarax , Milk of Magnesia, Trazodone   I certify that inpatient services furnished can reasonably be expected to improve the patient's condition.   Marsa GORMAN Rosser,  DO 12/12/2024, 12:50 PM

## 2024-12-12 NOTE — Plan of Care (Signed)
   Problem: Education: Goal: Emotional status will improve Outcome: Progressing

## 2024-12-12 NOTE — H&P (Addendum)
 " Psychiatric Admission Assessment Adult  Patient Identification: Jenna Santos MRN:  [id number] Date of Evaluation:  12/12/2024 Chief Complaint:  MDD (major depressive disorder), recurrent severe, without psychosis (HCC) [F33.2] Principal Diagnosis: Bipolar 1 disorder, depressed (HCC) Diagnosis:  Principal Problem:   Bipolar 1 disorder, depressed (HCC)  History of Present Illness:  Jenna Santos is a 18 yr old female who presented on 12/29 to [facility] after a Suicide Attempt (OD 400 mg of Trazodone ), she was admitted to [facility] on 12/31.  PPHx is significant for Bipolar Disorder, Anxiety, ADHD, and a remote history of Self Injurious Behavior (Cutting- middle school), and no history of Prior Suicide Attempts or Psychiatric Hospitalizations.  When asked what led to her hospitalization she reports that Monday she got into an argument with her mentor.  She reports that her mentor said things that were hurtful and this triggered her and she just became overwhelmed.  She reports that due to this she impulsively took her trazodone  and that she did not plan this nor has she ever planned it.  She reports after she did so she told her mental what happened who then called the ambulance.  She reports they have had arguments like this in the past but she has always been able to cope but this was just the final straw.  She reports that Pardy issue is being treated as a child and not a young adult.  She reports past psychiatric history significant for bipolar disorder, anxiety, and ADHD.  She reports no history of prior suicide attempt.  She reports a remote history of self-injurious behavior-cutting in middle school.  She reports no prior psychiatric hospitalizations.  She reports past medical history significant for vascular migraines and PCOS.  She reports past surgical history significant for wisdom tooth removal x2.  She is a concussion in 2023 when she was hit in the head with a volleyball at school.  She  reports no history of seizures.  She reports NKDA.  She reports she had been living with her mentor but that she has already made arrangements to live with her god sister once she is discharged from the hospital.  She reports she graduated high school and has some college.  She reports she does hope to go back to school to study psychology and become a warehouse manager.  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  She reports no alcohol use.  She reports no tobacco use.  He reports no illicit substance use.  She reports no current legal issues.  She reports no access to firearms.  Past her about the effectiveness of her medications.  She reports that in the past when she was on the higher dose of Lamictal  this was effective and that her outpatient psychiatrist had been working on titrating her back up.  She reports she has been on her current dose of 50 mg for about 2 weeks and is agreeable to increasing it.  She reports that Vraylar  has been helpful but feels like it would be more effective at a higher dose and is agreeable to increasing it.   Associated Signs/Symptoms: Depression Symptoms:  Reports None (Hypo) Manic Symptoms:  Reports None at present Anxiety Symptoms:  Excessive Worry, Psychotic Symptoms:  Reports None PTSD Symptoms: Reports None with work done in therapy Total Time spent with patient: 1 hour  Past Psychiatric History:  Bipolar Disorder, Anxiety, ADHD, and a remote history of  Self Injurious Behavior (Cutting- middle school), and no history of Prior Suicide Attempts or Psychiatric Hospitalizations.  Is the patient at risk to self? Yes.    Has the patient been a risk to self in the past 6 months? No.  Has the patient been a risk to self within the distant past? No.  Is the patient a risk to others? No.  Has the patient been a risk to others in the past 6 months? No.  Has the patient been a risk  to others within the distant past? No.   Columbia Scale:  Flowsheet Row [facility] (Current[facility] Most recent reading at 12/11/2024  3:00 PM ED from 12/10/2024 in [facility] Emergency Department at [facility] Most recent reading at 12/10/2024  6:02 PM ED from 12/10/2024 in [facility] Most recent reading at 12/10/2024  3:25 PM  C-SSRS RISK CATEGORY High Risk High Risk High Risk     Prior Inpatient Therapy: No. If yes, describe N/A  Prior Outpatient Therapy: Yes.   If yes, describe [place] and therapy   Alcohol Screening: 1. How often do you have a drink containing alcohol?: Never 2. How many drinks containing alcohol do you have on a typical day when you are drinking?: 1 or 2 3. How often do you have six or more drinks on one occasion?: Never AUDIT-C Score: 0 4. How often during the last year have you found that you were not able to stop drinking once you had started?: Never 5. How often during the last year have you failed to do what was normally expected from you because of drinking?: Never 6. How often during the last year have you needed a first drink in the morning to get yourself going after a heavy drinking session?: Never 7. How often during the last year have you had a feeling of guilt of remorse after drinking?: Never 8. How often during the last year have you been unable to remember what happened the night before because you had been drinking?: Never 9. Have you or someone else been injured as a result of your drinking?: No 10. Has a relative or friend or a doctor or another health worker been concerned about your drinking or suggested you cut down?: No Alcohol Use Disorder Identification Test Final Score (AUDIT): 0 Substance Abuse History in the last 12 months:  No. Consequences of Substance Abuse: NA Previous Psychotropic Medications: Yes  Lamictal , Concerta , Vraylar , Gabapentin ,  Hydroxyzine , Trazodone  Psychological Evaluations: No  Past Medical History:  Past Medical History:  Diagnosis Date   Acanthosis nigricans 10/17/2018   Amenorrhea 03/31/2020   Attention deficit hyperactivity disorder (ADHD) 07/22/2022   Dysmenorrhea 03/15/2022   Elevated hemoglobin A1c 11/07/2018   Obesity    Tinnitus of both ears 09/12/2023    Past Surgical History:  Procedure Laterality Date   WISDOM TOOTH EXTRACTION Bilateral 2024   Family History:  Family History  Problem Relation Age of Onset   Arthritis Maternal Grandmother    Hyperlipidemia Maternal Grandmother    Hypertension Maternal Grandmother    Stroke Maternal Grandmother    Cancer Maternal Grandfather        stomach   Diabetes Maternal Grandfather    Alcohol abuse Neg Hx    Asthma Neg Hx    Birth defects Neg Hx    COPD Neg Hx    Depression Neg Hx    Drug abuse Neg Hx  Early death Neg Hx    Hearing loss Neg Hx    Heart disease Neg Hx    Kidney disease Neg Hx    Learning disabilities Neg Hx    Mental illness Neg Hx    Mental retardation Neg Hx    Miscarriages / Stillbirths Neg Hx    Vision loss Neg Hx    Varicose Veins Neg Hx    Family Psychiatric  History:  Maternal Cousin- EtOH Abuse Maternal Cousin- EtOH Abuse, Drug Abuse No formal diagnosis' but suspected No Known Suicides  Tobacco Screening: Tobacco Use History[1]  BH Tobacco Counseling     Are you interested in Tobacco Cessation Medications?  No value filed. Counseled patient on smoking cessation:  No value filed. Reason Tobacco Screening Not Completed: No value filed.       Social History:  Social History   Substance and Sexual Activity  Alcohol Use No     Social History   Substance and Sexual Activity  Drug Use No    Additional Social History: Marital status: Single Are you sexually active?: No What is your sexual orientation?: Heterosexual Has your sexual activity been affected by drugs, alcohol, medication, or emotional  stress?: N/A Does patient have children?: No                         Allergies:  Allergies[2] Lab Results:  Results for orders placed or performed during the hospital encounter of 12/11/24 (from the past 48 hours)  Comprehensive metabolic panel     Status: None   Collection Time: 12/12/24  6:37 AM  Result Value Ref Range   Sodium 139 135 - 145 mmol/L   Potassium 4.1 3.5 - 5.1 mmol/L   Chloride 104 98 - 111 mmol/L   CO2 22 22 - 32 mmol/L   Glucose, Bld 96 70 - 99 mg/dL    Comment: Glucose reference range applies only to samples taken after fasting for at least 8 hours.   BUN 16 6 - 20 mg/dL   Creatinine, Ser 9.03 0.44 - 1.00 mg/dL   Calcium 9.4 8.9 - 89.6 mg/dL   Total Protein 7.1 6.5 - 8.1 g/dL   Albumin 4.0 3.5 - 5.0 g/dL   AST 16 15 - 41 U/L   ALT 10 0 - 44 U/L   Alkaline Phosphatase 78 38 - 126 U/L   Total Bilirubin 0.5 0.0 - 1.2 mg/dL   GFR, Estimated >39 >39 mL/min    Comment: (NOTE) Calculated using the CKD-EPI Creatinine Equation (2021)    Anion gap 13 5 - 15    Comment: Performed at [facility], [address], [place], [place] [address]  Lipid panel     Status: None   Collection Time: 12/12/24  6:37 AM  Result Value Ref Range   Cholesterol 161 0 - 200 mg/dL    Comment:        ATP III CLASSIFICATION:  <200     mg/dL   Desirable  799-760  mg/dL   Borderline High  >=759    mg/dL   High           Triglycerides 37 <150 mg/dL   HDL 59 >59 mg/dL   Total CHOL/HDL Ratio 2.7 RATIO   VLDL 7 0 - 40 mg/dL   LDL Cholesterol 94 0 - 99 mg/dL    Comment:        Total Cholesterol/HDL:CHD Risk Coronary Heart Disease Risk Table  Men   Women  1/2 Average Risk   3.4   3.3  Average Risk       5.0   4.4  2 X Average Risk   9.6   7.1  3 X Average Risk  23.4   11.0        Use the calculated Patient Ratio above and the CHD Risk Table to determine the patient's CHD Risk.        ATP III CLASSIFICATION (LDL):  <100     mg/dL    Optimal  899-870  mg/dL   Near or Above                    Optimal  130-159  mg/dL   Borderline  839-810  mg/dL   High  >809     mg/dL   Very High Performed at [facility], [address], [place], [place] [address]   Hemoglobin A1c     Status: None   Collection Time: 12/12/24  6:37 AM  Result Value Ref Range   Hgb A1c MFr Bld 5.4 4.8 - 5.6 %    Comment: (NOTE) Diagnosis of Diabetes The following HbA1c ranges recommended by the American Diabetes Association (ADA) may be used as an aid in the diagnosis of diabetes mellitus.  Hemoglobin             Suggested A1C NGSP%              Diagnosis  <5.7                   Non Diabetic  5.7-6.4                Pre-Diabetic  >6.4                   Diabetic  <7.0                   Glycemic control for                       adults with diabetes.     Mean Plasma Glucose 108.28 mg/dL    Comment: Performed at [facility], [address], [place], [place] [address]  CBC     Status: Abnormal   Collection Time: 12/12/24  6:37 AM  Result Value Ref Range   WBC 8.1 4.0 - 10.5 K/uL   RBC 5.44 (H) 3.87 - 5.11 MIL/uL   Hemoglobin 12.7 12.0 - 15.0 g/dL   HCT 59.0 63.9 - 53.9 %   MCV 75.2 (L) 80.0 - 100.0 fL   MCH 23.3 (L) 26.0 - 34.0 pg   MCHC 31.1 30.0 - 36.0 g/dL   RDW 84.7 88.4 - 84.4 %   Platelets 288 150 - 400 K/uL   nRBC 0.0 0.0 - 0.2 %    Comment: Performed at [facility], [address], [place], [place] [address]  TSH     Status: None   Collection Time: 12/12/24  6:37 AM  Result Value Ref Range   TSH 2.290 0.350 - 4.500 uIU/mL    Comment: Performed at [facility], [address], [place], [place] [address]  hCG, serum, qualitative     Status: None   Collection Time: 12/12/24  6:37 AM  Result Value Ref Range   Preg, Serum NEGATIVE NEGATIVE    Comment:        THE  SENSITIVITY OF THIS METHODOLOGY IS >10 mIU/mL. Performed at [facility] [address] [place], [place] [address]     Blood Alcohol level:  Lab Results  Component Value Date   [facility] <15 12/10/2024    Metabolic Disorder Labs:  Lab Results  Component Value Date   HGBA1C 5.4 12/12/2024   MPG 108.28 12/12/2024   MPG 120 12/21/2023   Lab Results  Component Value Date   PROLACTIN 7.7 04/27/2022   Lab Results  Component Value Date   CHOL 161 12/12/2024   TRIG 37 12/12/2024   HDL 59 12/12/2024   CHOLHDL 2.7 12/12/2024   VLDL 7 12/12/2024   LDLCALC 94 12/12/2024   LDLCALC 76 12/21/2023    Current Medications: Current Facility-Administered Medications  Medication Dose Route Frequency Provider Last Rate Last Admin   acetaminophen  (TYLENOL ) tablet 650 mg  650 mg Oral Q6H PRN Tex Drilling, NP       albuterol  (VENTOLIN  HFA) 108 (90 Base) MCG/ACT inhaler 1-2 puff  1-2 puff Inhalation Q6H PRN Jalecia Leon S, DO       alum & mag hydroxide-simeth (MAALOX/MYLANTA) 200-200-20 MG/5ML suspension 30 mL  30 mL Oral Q6H PRN Nkwenti, Doris, NP       cariprazine  (VRAYLAR ) capsule 3 mg  3 mg Oral Daily Shawn Carattini S, DO   3 mg at 12/12/24 1021   haloperidol  (HALDOL ) tablet 5 mg  5 mg Oral TID PRN Tex Drilling, NP       And   diphenhydrAMINE  (BENADRYL ) capsule 50 mg  50 mg Oral TID PRN Tex Drilling, NP       haloperidol  lactate (HALDOL ) injection 5 mg  5 mg Intramuscular TID PRN Tex Drilling, NP       And   diphenhydrAMINE  (BENADRYL ) injection 50 mg  50 mg Intramuscular TID PRN Tex Drilling, NP       And   LORazepam  (ATIVAN ) injection 2 mg  2 mg Intramuscular TID PRN Tex Drilling, NP       haloperidol  lactate (HALDOL ) injection 10 mg  10 mg Intramuscular TID PRN Tex Drilling, NP       And   diphenhydrAMINE  (BENADRYL ) injection 50 mg  50 mg Intramuscular TID PRN Tex Drilling, NP       And   LORazepam  (ATIVAN ) injection 2 mg  2 mg Intramuscular TID PRN Tex Drilling, NP       hydrOXYzine  (ATARAX ) tablet 25 mg  25 mg Oral TID PRN Tex Drilling, NP   25 mg at 12/11/24 2046   lamoTRIgine  (LAMICTAL ) tablet 75 mg  75 mg Oral Daily Lavanda Nevels S, DO   75 mg at 12/12/24 1021   magnesium  hydroxide (MILK OF MAGNESIA) suspension 30 mL  30 mL Oral Daily PRN Tex Drilling, NP       traZODone  (DESYREL ) tablet 50 mg  50 mg Oral QHS PRN Tex Drilling, NP   50 mg at 12/11/24 2046   PTA Medications: Medications Prior to Admission  Medication Sig Dispense Refill Last Dose/Taking   albuterol  (VENTOLIN  HFA) 108 (90 Base) MCG/ACT inhaler Inhale 1-2 puffs into the lungs every 6 (six) hours as needed for wheezing or shortness of breath. 6.7 g 0    EPINEPHrine  (EPIPEN  2-PAK) 0.3 mg/0.3 mL IJ SOAJ injection Inject 0.3 mg into the muscle as needed for anaphylaxis. 2 each 6    hydrOXYzine  (ATARAX ) 25 MG tablet Take 25 mg by mouth daily.      lamoTRIgine  (LAMICTAL ) 25 MG  tablet Take 50 mg by mouth.      levonorgestrel  (MIRENA ) 20 MCG/DAY IUD 1 each by Intrauterine route once.      methylphenidate  36 MG PO CR tablet Take 36 mg by mouth daily.      traZODone  (DESYREL ) 50 MG tablet Take 50-100 mg by mouth at bedtime as needed.      VRAYLAR  1.5 MG capsule Take 1.5 mg by mouth daily.       AIMS:  ,  ,  ,  ,  ,  ,    Musculoskeletal: Strength & Muscle Tone: within normal limits Gait & Station: normal Patient leans: N/A            Psychiatric Specialty Exam:  Presentation  General Appearance:  Appropriate for Environment; Casual  Eye Contact: Good  Speech: Clear and Coherent  Speech Volume: Normal  Handedness: Right   Mood and Affect  Mood: Dysphoric  Affect: Congruent   Thought Process  Thought Processes: Coherent; Goal Directed  Duration of Psychotic Symptoms:N/A Past Diagnosis of Schizophrenia or Psychoactive disorder: No  Descriptions of Associations:Intact  Orientation:Full (Time, Place and Person)  Thought Content:Logical; WDL  Hallucinations:Hallucinations: None  Ideas of  Reference:None  Suicidal Thoughts:Suicidal Thoughts: No  Homicidal Thoughts:Homicidal Thoughts: No   Sensorium  Memory: Immediate Fair; Recent Fair  Judgment: Intact  Insight: Present   Executive Functions  Concentration: Fair  Attention Span: Fair  Recall: [employer] of Knowledge: Fair  Language: Fair   Psychomotor Activity  Psychomotor Activity:Psychomotor Activity: Normal   Assets  Assets: Communication Skills; Desire for Improvement; Physical Health; Resilience; Social Support   Sleep  Sleep:Sleep: Good  Estimated Sleeping Duration (Last 24 Hours): 8.75-9.00 hours   Physical Exam: Physical Exam Vitals and nursing note reviewed.  Constitutional:      General: She is not in acute distress.    Appearance: Normal appearance. She is normal weight. She is not ill-appearing or toxic-appearing.  HENT:     Head: Normocephalic and atraumatic.  Pulmonary:     Effort: Pulmonary effort is normal.  Musculoskeletal:        General: Normal range of motion.  Neurological:     General: No focal deficit present.     Mental Status: She is alert.    Review of Systems  Respiratory:  Negative for cough and shortness of breath.   Cardiovascular:  Negative for chest pain.  Gastrointestinal:  Negative for abdominal pain, constipation, diarrhea, nausea and vomiting.  Neurological:  Negative for dizziness, weakness and headaches.  Psychiatric/Behavioral:  Positive for depression (mild). Negative for hallucinations and suicidal ideas. The patient is nervous/anxious (mild).    Blood pressure 116/71, pulse 93, temperature 99 F (37.2 C), temperature source Oral, resp. rate 20, height 5' 2.5 (1.588 m), weight 97.3 kg, SpO2 100%. Body mass index is 38.63 kg/m.  Treatment Plan Summary: Daily contact with patient to assess and evaluate symptoms and progress in treatment and Medication management  Tsuyako L. Santos is a 18 yr old female who presented on 12/29 to [facility]  after a Suicide Attempt (OD 400 mg of Trazodone ), she was admitted to [facility] on 12/31.  PPHx is significant for Bipolar Disorder, Anxiety, ADHD, and a remote history of Self Injurious Behavior (Cutting- middle school), and no history of Prior Suicide Attempts or Psychiatric Hospitalizations.   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  She reports that Lamictal  and Vraylar  are helpful so we will increase her Vraylar  and continue with the titration of Lamictal .     Bipolar Disorder: -Increase Vraylar  to 3 mg daily for mood stability -Increase Lamictal  to 75 mg daily for mood stability -Continue Agitation Protocol: Haldol /Ativan /Benadryl    Asthma: -Continue Albuterol  1-2 puffs q6 PRN    -Continue PRN's: Tylenol , Maalox, Atarax , Milk of Magnesia, Trazodone    Observation Level/Precautions:  15 minute checks  Laboratory:  CMP: WNL, CBC: WNL except RBC: 5.85,  MCV: 75.2,  MCH: 22.9, UDS: Neg,  Lipid Panel: WNL, A1c: 5.4, TSH: 2.290,  hCG: Neg, EKG: NSR w/ Sinus Arrhythmia w/Qtc: 447  Psychotherapy:    Medications:  Vraylar , Lamictal   Consultations:    Discharge Concerns:    Estimated LOS: 3-4 days  Other:     Physician Treatment Plan for Primary Diagnosis: Bipolar 1 disorder, depressed (HCC) Long Term Goal(s): Improvement in symptoms so as ready for discharge  Short Term Goals: Ability to verbalize feelings will improve, Ability to demonstrate self-control will improve, Ability to identify and develop effective coping behaviors will improve, and Ability to maintain clinical measurements within normal limits will improve  Physician Treatment Plan for Secondary Diagnosis: Principal Problem:   Bipolar 1 disorder, depressed (HCC)  Long Term Goal(s): Improvement in symptoms so as ready for discharge  Short Term Goals: Ability to verbalize feelings will improve,  Ability to demonstrate self-control will improve, Ability to identify and develop effective coping behaviors will improve, and Ability to maintain clinical measurements within normal limits will improve  I certify that inpatient services furnished can reasonably be expected to improve the patient's condition.    Jenna GORMAN Rosser, DO 12/31/202512:47 PM     [1]  Social History Tobacco Use  Smoking Status Never   Passive exposure: Never  Smokeless Tobacco Never  [2]  Allergies Allergen Reactions   Banana Anaphylaxis   "

## 2024-12-13 DIAGNOSIS — F319 Bipolar disorder, unspecified: Secondary | ICD-10-CM | POA: Diagnosis not present

## 2024-12-13 LAB — PROLACTIN: Prolactin: 23.9 ng/mL (ref 4.8–33.4)

## 2024-12-13 MED ORDER — SALINE SPRAY 0.65 % NA SOLN: 1.0000 | NASAL | Status: DC | PRN

## 2024-12-13 MED ORDER — LORATADINE 10 MG PO TABS
10.0000 mg | ORAL_TABLET | Freq: Every day | ORAL | Status: DC
  Administered 2024-12-13 – 2024-12-14 (×2): 10 mg via ORAL
  Filled 2024-12-13 (×2): qty 1

## 2024-12-13 NOTE — Group Note (Signed)
 Date:  12/13/2024 Time:  6:24 PM  Group Topic/Focus: Occupational Therapy    Pt did attend occupational therapy group  Sherita Decoste R Jerry Haugen 12/13/2024, 6:24 PM

## 2024-12-13 NOTE — Plan of Care (Signed)
 Nurse discussed coping skills with patient.

## 2024-12-13 NOTE — Progress Notes (Signed)
 Patient stated she was taking concerta  36 mg at home approximately 1 yr.

## 2024-12-13 NOTE — Group Note (Signed)
 Occupational Therapy Group Note  Group Topic:Coping Skills  Group Date: 12/13/2024 Start Time: 1500 End Time: 1530 Facilitators: Dot [place] MATSU, OT   Group Description: Group encouraged increased engagement and participation through discussion and activity focused on Coping Ahead. Patients were split up into teams and selected a card from a stack of positive coping strategies. Patients were instructed to act out/charade the coping skill for other peers to guess and receive points for their team. Discussion followed with a focus on identifying additional positive coping strategies and patients shared how they were going to cope ahead over the weekend while continuing hospitalization stay.  Therapeutic Goal(s): Identify positive vs negative coping strategies. Identify coping skills to be used during hospitalization vs coping skills outside of hospital/at home Increase participation in therapeutic group environment and promote engagement in treatment   Participation Level: Engaged   Participation Quality: Independent   Behavior: Appropriate   Speech/Thought Process: Relevant   Affect/Mood: Appropriate   Insight: Fair   Judgement: Fair      Modes of Intervention: Education  Patient Response to Interventions:  Attentive   Plan: Continue to engage patient in OT groups 2 - 3x/week.  12/13/2024  [place] MATSU Dot, OT  Tavarious Freel, OT

## 2024-12-13 NOTE — Progress Notes (Signed)
(  Sleep Hours) -7.25  (Any PRNs that were needed, meds refused, or side effects to meds)- hydroxyzine  25; Trazodone  50mg   (Any disturbances and when (visitation, over night)-none  (Concerns raised by the patient)- none  (SI/HI/AVH)-denied

## 2024-12-13 NOTE — Group Note (Signed)
 LCSW Group Therapy Note   Group Date: 12/13/2024 Start Time: 1100 End Time: 1200   Participation:  patient was present.  She listened and was respectful but didn't participate in the discussion.    Type of Therapy:  Group Therapy  Topic:  Healing Hearts:  A Safe Space for Grief  Objective:   The objective of this class, Healing Hearts:  A Safe Space for Grief, is to create a compassionate environment where participants can process their grief, explore different stages of grief, and discover ways to honor their loved ones through personal rituals.  3 Goals: Provide a safe and supportive space where participants feel comfortable sharing their feelings and experiences of grief without judgment. Educate participants about the stages of grief and emphasize that there is no right way to grieve or a fixed timeline for healing. Introduce the concept of rituals as a means to process grief, allowing individuals to honor their loved ones in a personal and meaningful way.  Summary:  In Healing Hearts: A Safe Space for Grief, we explored the unique and personal journey of grief, emphasizing that everyone experiences it differently.  We discussed the five stages of grief (denial, anger, bargaining, depression, and acceptance), with the understanding that grief is not linear.  Rituals were introduced as a way to help cope with loss, offering comfort and connection through meaningful actions such as lighting candles or taking memory walks. Participants were encouraged to express their emotions, focus on self-care, and reflect on moments of gratitude for their loved ones, recognizing that healing is a process and there is no timeline for grief.  Therapeutic Modalities: Elements of CBT: Challenge thoughts, reframe beliefs, self-compassion Elements of DBT: Mindfulness, distress tolerance, emotion regulation Supportive Therapy:  Provide validation, foster a safe and supportive group environment, normalize  grief   Catherene MALVA Dynes, LCSWA 12/13/2024  12:30 PM

## 2024-12-13 NOTE — [facility] Suicide Risk Assessment (Signed)
 [facility] INPATIENT:  Family/Significant Other Suicide Prevention Education  Suicide Prevention Education:  Education Completed; Bethany Kapuscinski [phone],  (name of family member/significant other) has been identified by the patient as the family member/significant other with whom the patient will be residing, and identified as the person(s) who will aid the patient in the event of a mental health crisis (suicidal ideations/suicide attempt).  With written consent from the patient, the family member/significant other has been provided the following suicide prevention education, prior to the and/or following the discharge of the patient.  Heather reports no concerns with pt's access to any weapons, dangerous objects, medications, etc.    The suicide prevention education provided includes the following: Suicide risk factors Suicide prevention and interventions National Suicide Hotline telephone number [facility] assessment telephone number [facility] Emergency Assistance 911 [facility] and/or Residential Mobile Crisis Unit telephone number  Request made of family/significant other to: Remove weapons (e.g., guns, rifles, knives), all items previously/currently identified as safety concern.   Remove drugs/medications (over-the-counter, prescriptions, illicit drugs), all items previously/currently identified as a safety concern.  The family member/significant other verbalizes understanding of the suicide prevention education information provided.  The family member/significant other agrees to remove the items of safety concern listed above.  Derick JONELLE Blanch, LCSW 12/13/2024, 11:46 AM

## 2024-12-13 NOTE — Progress Notes (Signed)
 [facility] MD Progress Note  12/13/2024 9:01 AM Jenna Santos  MRN:  [id number] Subjective:   Jenna Santos is a 19 yr old female who presented on 12/29 to [facility] after a Suicide Attempt (OD 400 mg of Trazodone ), she was admitted to [facility] on 12/31.  PPHx is significant for Bipolar Disorder, Anxiety, ADHD, and a remote history of Self Injurious Behavior (Cutting- middle school), and no history of Prior Suicide Attempts or Psychiatric Hospitalizations.    Case was discussed in the multidisciplinary team. MAR was reviewed and patient was compliant with medications.  She received PRN Hydroxyzine  and Trazodone  yesterday.   Psychiatric Team made the following recommendations yesterday: -Increase Vraylar to 3 mg daily for mood stability -Increase Lamictal to 75 mg daily for mood stability    On interview today patient reports she slept good last night.  She reports her appetite is doing good.  She reports no SI, HI, or AVH.  She reports no Paranoia or Ideas of Reference.  She reports no issues with her medications.  She reports that she is feeling much better with the medication increases.  She reports no signs of a rash.  She reports some congestion and requests nasal spray and Claritin.  She reports no other concerns at present.  Principal Problem: Bipolar 1 disorder, depressed (HCC) Diagnosis: Principal Problem:   Bipolar 1 disorder, depressed (HCC)  Total Time spent with patient:  I personally spent 35 minutes on the unit in direct patient care. The direct patient care time included face-to-face time with the patient, reviewing the patient's chart, communicating with other professionals, and coordinating care.    Past Psychiatric History:  Bipolar Disorder, Anxiety, ADHD, and a remote history of Self Injurious Behavior (Cutting- middle school), and no history of Prior Suicide Attempts or Psychiatric Hospitalizations.   Past Medical History:  Past Medical History:  Diagnosis Date    Acanthosis nigricans 10/17/2018   Amenorrhea 03/31/2020   Attention deficit hyperactivity disorder (ADHD) 07/22/2022   Dysmenorrhea 03/15/2022   Elevated hemoglobin A1c 11/07/2018   Obesity    Tinnitus of both ears 09/12/2023    Past Surgical History:  Procedure Laterality Date   WISDOM TOOTH EXTRACTION Bilateral 2024   Family History:  Family History  Problem Relation Age of Onset   Arthritis Maternal Grandmother    Hyperlipidemia Maternal Grandmother    Hypertension Maternal Grandmother    Stroke Maternal Grandmother    Cancer Maternal Grandfather        stomach   Diabetes Maternal Grandfather    Alcohol abuse Neg Hx    Asthma Neg Hx    Birth defects Neg Hx    COPD Neg Hx    Depression Neg Hx    Drug abuse Neg Hx    Early death Neg Hx    Hearing loss Neg Hx    Heart disease Neg Hx    Kidney disease Neg Hx    Learning disabilities Neg Hx    Mental illness Neg Hx    Mental retardation Neg Hx    Miscarriages / Stillbirths Neg Hx    Vision loss Neg Hx    Varicose Veins Neg Hx    Family Psychiatric  History:  Maternal Cousin- EtOH Abuse Maternal Cousin- EtOH Abuse, Drug Abuse No formal diagnosis' but suspected No Known Suicides  Social History:  Social History   Substance and Sexual Activity  Alcohol Use No     Social History   Substance and Sexual Activity  Drug Use  No    Social History   Socioeconomic History   Marital status: Single    Spouse name: Not on file   Number of children: Not on file   Years of education: Not on file   Highest education level: Not on file  Occupational History   Not on file  Tobacco Use   Smoking status: Never    Passive exposure: Never   Smokeless tobacco: Never  Vaping Use   Vaping status: Never Used  Substance and Sexual Activity   Alcohol use: No   Drug use: No   Sexual activity: Yes    Birth control/protection: Condom, I.U.D.  Other Topics Concern   Not on file  Social History Narrative   Lives at home  with mom   Father not involved   12th grade at [employer]   Left handed          Social Drivers of Health   Tobacco Use: Low Risk (12/11/2024)   Patient History    Smoking Tobacco Use: Never    Smokeless Tobacco Use: Never    Passive Exposure: Never  Financial Resource Strain: Not on file  Food Insecurity: No Food Insecurity (12/11/2024)   Epic    Worried About Programme Researcher, Broadcasting/film/video in the Last Year: Never true    Ran Out of Food in the Last Year: Never true  Transportation Needs: No Transportation Needs (12/11/2024)   Epic    Lack of Transportation (Medical): No    Lack of Transportation (Non-Medical): No  Physical Activity: Not on file  Stress: Not on file  Social Connections: Not on file  Depression (PHQ2-9): Medium Risk (12/10/2024)   Depression (PHQ2-9)    PHQ-2 Score: 9  Alcohol Screen: Low Risk (12/11/2024)   Alcohol Screen    Last Alcohol Screening Score (AUDIT): 0  Housing: High Risk (12/11/2024)   Epic    Unable to Pay for Housing in the Last Year: No    Number of Times Moved in the Last Year: 2    Homeless in the Last Year: No  Utilities: Not At Risk (12/11/2024)   Epic    Threatened with loss of utilities: No  Health Literacy: Not on file   Additional Social History:                         Sleep: Good Estimated Sleeping Duration (Last 24 Hours): 5.75-8.00 hours  Appetite:  Good  Current Medications: Current Facility-Administered Medications  Medication Dose Route Frequency Provider Last Rate Last Admin   acetaminophen  (TYLENOL ) tablet 650 mg  650 mg Oral Q6H PRN Nkwenti, Doris, NP       albuterol  (VENTOLIN  HFA) 108 (90 Base) MCG/ACT inhaler 1-2 puff  1-2 puff Inhalation Q6H PRN Natasa Stigall S, Jenna Santos       alum & mag hydroxide-simeth (MAALOX/MYLANTA) 200-200-20 MG/5ML suspension 30 mL  30 mL Oral Q6H PRN Nkwenti, Doris, NP       cariprazine (VRAYLAR) capsule 3 mg  3 mg Oral Daily Erby Sanderson S, Jenna Santos   3 mg at 12/13/24  0750   haloperidol (HALDOL) tablet 5 mg  5 mg Oral TID PRN Tex Drilling, NP       And   diphenhydrAMINE  (BENADRYL ) capsule 50 mg  50 mg Oral TID PRN Tex Drilling, NP       haloperidol lactate (HALDOL) injection 5 mg  5 mg Intramuscular TID PRN Tex Drilling, NP  And   diphenhydrAMINE  (BENADRYL ) injection 50 mg  50 mg Intramuscular TID PRN Tex Drilling, NP       And   LORazepam (ATIVAN) injection 2 mg  2 mg Intramuscular TID PRN Tex Drilling, NP       haloperidol lactate (HALDOL) injection 10 mg  10 mg Intramuscular TID PRN Tex Drilling, NP       And   diphenhydrAMINE  (BENADRYL ) injection 50 mg  50 mg Intramuscular TID PRN Tex Drilling, NP       And   LORazepam (ATIVAN) injection 2 mg  2 mg Intramuscular TID PRN Tex Drilling, NP       hydrOXYzine  (ATARAX ) tablet 25 mg  25 mg Oral TID PRN Tex Drilling, NP   25 mg at 12/12/24 2104   lamoTRIgine (LAMICTAL) tablet 75 mg  75 mg Oral Daily Jassen Sarver S, Jenna Santos   75 mg at 12/13/24 0751   loratadine (CLARITIN) tablet 10 mg  10 mg Oral Daily Chozen Latulippe S, Jenna Santos   10 mg at 12/13/24 9149   magnesium hydroxide (MILK OF MAGNESIA) suspension 30 mL  30 mL Oral Daily PRN Tex Drilling, NP       sodium chloride  (OCEAN) 0.65 % nasal spray 1 spray  1 spray Each Nare PRN Elainna Eshleman S, Jenna Santos       traZODone  (DESYREL ) tablet 50 mg  50 mg Oral QHS PRN Tex Drilling, NP   50 mg at 12/12/24 2104    Lab Results:  Results for orders placed or performed during the hospital encounter of 12/11/24 (from the past 48 hours)  Comprehensive metabolic panel     Status: None   Collection Time: 12/12/24  6:37 AM  Result Value Ref Range   Sodium 139 135 - 145 mmol/L   Potassium 4.1 3.5 - 5.1 mmol/L   Chloride 104 98 - 111 mmol/L   CO2 22 22 - 32 mmol/L   Glucose, Bld 96 70 - 99 mg/dL    Comment: Glucose reference range applies only to samples taken after fasting for at least 8 hours.   BUN 16 6 - 20 mg/dL   Creatinine, Ser 9.03  0.44 - 1.00 mg/dL   Calcium 9.4 8.9 - 89.6 mg/dL   Total Protein 7.1 6.5 - 8.1 g/dL   Albumin 4.0 3.5 - 5.0 g/dL   AST 16 15 - 41 U/L   ALT 10 0 - 44 U/L   Alkaline Phosphatase 78 38 - 126 U/L   Total Bilirubin 0.5 0.0 - 1.2 mg/dL   GFR, Estimated >39 >39 mL/min    Comment: (NOTE) Calculated using the CKD-EPI Creatinine Equation (2021)    Anion gap 13 5 - 15    Comment: Performed at [facility], [address], [place], [place] [address]  Prolactin     Status: None   Collection Time: 12/12/24  6:37 AM  Result Value Ref Range   Prolactin 23.9 4.8 - 33.4 ng/mL    Comment: (NOTE) Performed At: [facility] [address] [place], [place] [id number] Jennette Shorter MD Ey:[id number]   Lipid panel     Status: None   Collection Time: 12/12/24  6:37 AM  Result Value Ref Range   Cholesterol 161 0 - 200 mg/dL    Comment:        ATP III CLASSIFICATION:  <200     mg/dL   Desirable  799-760  mg/dL   Borderline High  >=759    mg/dL   High  Triglycerides 37 <150 mg/dL   HDL 59 >59 mg/dL   Total CHOL/HDL Ratio 2.7 RATIO   VLDL 7 0 - 40 mg/dL   LDL Cholesterol 94 0 - 99 mg/dL    Comment:        Total Cholesterol/HDL:CHD Risk Coronary Heart Disease Risk Table                     Men   Women  1/2 Average Risk   3.4   3.3  Average Risk       5.0   4.4  2 X Average Risk   9.6   7.1  3 X Average Risk  23.4   11.0        Use the calculated Patient Ratio above and the CHD Risk Table to determine the patient's CHD Risk.        ATP III CLASSIFICATION (LDL):  <100     mg/dL   Optimal  899-870  mg/dL   Near or Above                    Optimal  130-159  mg/dL   Borderline  839-810  mg/dL   High  >809     mg/dL   Very High Performed at [facility], [address] [place], [place] [address]   Hemoglobin A1c     Status: None   Collection Time: 12/12/24  6:37 AM  Result Value Ref Range   Hgb A1c MFr Bld 5.4 4.8 - 5.6 %     Comment: (NOTE) Diagnosis of Diabetes The following HbA1c ranges recommended by the American Diabetes Association (ADA) may be used as an aid in the diagnosis of diabetes mellitus.  Hemoglobin             Suggested A1C NGSP%              Diagnosis  <5.7                   Non Diabetic  5.7-6.4                Pre-Diabetic  >6.4                   Diabetic  <7.0                   Glycemic control for                       adults with diabetes.     Mean Plasma Glucose 108.28 mg/dL    Comment: Performed at [facility], [address] [place], [place] [address]  CBC     Status: Abnormal   Collection Time: 12/12/24  6:37 AM  Result Value Ref Range   WBC 8.1 4.0 - 10.5 K/uL   RBC 5.44 (H) 3.87 - 5.11 MIL/uL   Hemoglobin 12.7 12.0 - 15.0 g/dL   HCT 59.0 63.9 - 53.9 %   MCV 75.2 (L) 80.0 - 100.0 fL   MCH 23.3 (L) 26.0 - 34.0 pg   MCHC 31.1 30.0 - 36.0 g/dL   RDW 84.7 88.4 - 84.4 %   Platelets 288 150 - 400 K/uL   nRBC 0.0 0.0 - 0.2 %    Comment: Performed at [facility], [address], [place], [place] [address]  TSH     Status: None   Collection Time: 12/12/24  6:37 AM  Result Value Ref Range   TSH 2.290 0.350 - 4.500 uIU/mL    Comment: Performed at [facility], [address] [place], [place] [address]  hCG, serum, qualitative     Status: None   Collection Time: 12/12/24  6:37 AM  Result Value Ref Range   Preg, Serum NEGATIVE NEGATIVE    Comment:        THE SENSITIVITY OF THIS METHODOLOGY IS >10 mIU/mL. Performed at [facility], [address], [place], [place] [address]     Blood Alcohol level:  Lab Results  Component Value Date   [facility] <15 12/10/2024    Metabolic Disorder Labs: Lab Results  Component Value Date   HGBA1C 5.4 12/12/2024   MPG 108.28 12/12/2024   MPG 120 12/21/2023   Lab Results  Component Value Date   PROLACTIN 23.9 12/12/2024   PROLACTIN 7.7 04/27/2022   Lab Results   Component Value Date   CHOL 161 12/12/2024   TRIG 37 12/12/2024   HDL 59 12/12/2024   CHOLHDL 2.7 12/12/2024   VLDL 7 12/12/2024   LDLCALC 94 12/12/2024   LDLCALC 76 12/21/2023    Physical Findings: AIMS:  ,  ,  ,  ,  ,  ,   CIWA:    COWS:     Musculoskeletal: Strength & Muscle Tone: within normal limits Gait & Station: normal Patient leans: N/A  Psychiatric Specialty Exam:  Presentation  General Appearance:  Appropriate for Environment; Casual  Eye Contact: Good  Speech: Clear and Coherent; Normal Rate  Speech Volume: Normal  Handedness: Right   Mood and Affect  Mood: -- (better)  Affect: Appropriate; Congruent   Thought Process  Thought Processes: Coherent; Goal Directed  Descriptions of Associations:Intact  Orientation:Full (Time, Place and Person)  Thought Content:Logical; WDL  History of Schizophrenia/Schizoaffective disorder:No  Duration of Psychotic Symptoms:No data recorded Hallucinations:Hallucinations: None  Ideas of Reference:None  Suicidal Thoughts:Suicidal Thoughts: No  Homicidal Thoughts:Homicidal Thoughts: No   Sensorium  Memory: Immediate Fair; Recent Fair  Judgment: Fair  Insight: Fair   Art Therapist  Concentration: Fair  Attention Span: Fair  Recall: [employer] of Knowledge: Fair  Language: Fair   Psychomotor Activity  Psychomotor Activity: Psychomotor Activity: Normal   Assets  Assets: Communication Skills; Desire for Improvement; Resilience; Physical Health; Social Support; Housing   Sleep  Sleep: Sleep: Good    Physical Exam: Physical Exam Vitals and nursing note reviewed.  Constitutional:      General: She is not in acute distress.    Appearance: Normal appearance. She is normal weight. She is not ill-appearing or toxic-appearing.  HENT:     Head: Normocephalic and atraumatic.  Pulmonary:     Effort: Pulmonary effort is normal.  Musculoskeletal:        General:  Normal range of motion.  Neurological:     General: No focal deficit present.     Mental Status: She is alert.    Review of Systems  HENT:  Positive for congestion.   Respiratory:  Negative for cough and shortness of breath.   Cardiovascular:  Negative for chest pain.  Gastrointestinal:  Negative for abdominal pain, constipation, diarrhea, nausea and vomiting.  Neurological:  Negative for dizziness, weakness and headaches.  Psychiatric/Behavioral:  Negative for depression, hallucinations and suicidal ideas. The patient is not nervous/anxious.    Blood pressure 108/64, pulse 77, temperature 98.7 F (37.1 C), temperature source Oral, resp. rate 16, height 5' 2.5 (1.588  m), weight 97.3 kg, SpO2 100%. Body mass index is 38.63 kg/m.   Treatment Plan Summary: Daily contact with patient to assess and evaluate symptoms and progress in treatment and Medication management  Jenna Santos is a 19 yr old female who presented on 12/29 to [facility] after a Suicide Attempt (OD 400 mg of Trazodone ), she was admitted to [facility] on 12/31.  PPHx is significant for Bipolar Disorder, Anxiety, ADHD, and a remote history of Self Injurious Behavior (Cutting- middle school), and no history of Prior Suicide Attempts or Psychiatric Hospitalizations.     Jenna Santos tolerated the increase in Lamictal and Vraylar well without issue.  She is reporting some congestion so we will start Nasal spray and Claritin.  We will not make any other changes to her medications at this time.  We will continue to monitor.      Bipolar Disorder: -Continue Vraylar 3 mg daily for mood stability -Continue Lamictal 75 mg daily for mood stability -Continue Agitation Protocol: Haldol/Ativan/Benadryl      Asthma: -Continue Albuterol  1-2 puffs q6 PRN    Congestion: -Start Claritin 10 mg daily -Start Saline Nasal Spray 1 spray per nare PRN daily     -Continue PRN's: Tylenol , Maalox, Atarax , Milk of Magnesia, Trazodone    --  The  risks/benefits/side-effects/alternatives to medications were discussed in detail with the patient and time was given for questions. The patient consents to medication trials.                -- Metabolic profile and EKG monitoring obtained while on an atypical antipsychotic (BMI: 38.63, Lipid Panel: WNL, HbgA1c: 5.4, EKG: NSR w/ Sinus Arrhythmia w/QTc: 447)              -- Encouraged patient to participate in unit milieu and in scheduled group therapies              -- Short Term Goals: Ability to identify changes in lifestyle to reduce recurrence of condition will improve, Ability to verbalize feelings will improve, Ability to disclose and discuss suicidal ideas, Ability to demonstrate self-control will improve, Ability to identify and develop effective coping behaviors will improve, Ability to maintain clinical measurements within normal limits will improve, Compliance with prescribed medications will improve, and Ability to identify triggers associated with substance abuse/mental health issues will improve             -- Long Term Goals: Improvement in symptoms so as ready for discharge   Safety and Monitoring:             -- Voluntary admission to inpatient psychiatric unit for safety, stabilization and treatment             -- Daily contact with patient to assess and evaluate symptoms and progress in treatment             -- Patient's case to be discussed in multi-disciplinary team meeting             -- Observation Level : q15 minute checks             -- Vital signs:  q12 hours             -- Precautions: suicide, elopement, and assault  Discharge Planning:              -- Social work and case management to assist with discharge planning and identification of hospital follow-up needs prior to discharge             --  Estimated LOS: 1-2 more days             -- Discharge Concerns: Need to establish a safety plan; Medication compliance and effectiveness             -- Discharge Goals: Return  home with outpatient referrals for mental health follow-up including medication management/psychotherapy    Jenna GORMAN Rosser, Jenna Santos 12/13/2024, 9:01 AM

## 2024-12-13 NOTE — Group Note (Signed)
 Date:  12/13/2024 Time:  12:21 PM  Group Topic/Focus: Social Work    Pt did attend social work group  Jenna Santos R Jenna Santos 12/13/2024, 12:21 PM

## 2024-12-13 NOTE — Group Note (Signed)
 Date:  12/13/2024 Time:  9:43 AM  Group Topic/Focus:  Goals Group:   The focus of this group is to help patients establish daily goals to achieve during treatment and discuss how the patient can incorporate goal setting into their daily lives to aide in recovery. Orientation:   The focus of this group is to educate the patient on the purpose and policies of crisis stabilization and provide a format to answer questions about their admission.  The group details unit policies and expectations of patients while admitted.    Participation Level:  Active  Participation Quality:  Appropriate, Attentive, Sharing, and Supportive  Affect:  Appropriate  Cognitive:  Alert and Appropriate  Insight: Appropriate and Good  Engagement in Group:  Engaged  Modes of Intervention:  Activity and Discussion  Additional Comments:  N/A  Jenna Santos 12/13/2024, 9:43 AM

## 2024-12-13 NOTE — Plan of Care (Signed)
 Nurse discussed anxiety, depression and coping skills with patient.

## 2024-12-13 NOTE — Group Note (Signed)
 Date:  12/13/2024 Time:  1:49 PM  Group Topic/Focus: Emotional Wellness Aims to help participants gain a deeper understanding of their anger and its underlying emotions. By exploring the iceberg model, the group focuses on recognizing that anger is often just the visible tip of a much larger emotional experience. The goal is to teach strategies for managing anger more effectively, promoting emotional awareness, and encouraging healthier emotional expression.     Participation Level:  Active  Participation Quality:  Appropriate, Attentive, and Sharing  Affect:  Appropriate  Cognitive:  Appropriate  Insight: Appropriate and Good  Engagement in Group:  Engaged  Modes of Intervention:  Discussion and Education  Additional Comments:  N/A  Jenna Santos 12/13/2024, 1:49 PM

## 2024-12-13 NOTE — Plan of Care (Signed)
   Problem: Education: Goal: Knowledge of [place] [facility] Education information/materials will improve Outcome: Progressing Goal: Emotional status will improve Outcome: Progressing Goal: Mental status will improve Outcome: Progressing

## 2024-12-14 DIAGNOSIS — F319 Bipolar disorder, unspecified: Secondary | ICD-10-CM | POA: Diagnosis not present

## 2024-12-14 MED ORDER — CARIPRAZINE HCL 3 MG PO CAPS: 3.0000 mg | ORAL_CAPSULE | Freq: Every day | ORAL | 0 refills | Status: AC

## 2024-12-14 MED ORDER — LAMOTRIGINE 25 MG PO TABS: 75.0000 mg | ORAL_TABLET | Freq: Every day | ORAL | 0 refills | Status: AC

## 2024-12-14 NOTE — Group Note (Signed)
 Date:  12/14/2024 Time:  10:51 AM  Group Topic/Focus: Goals Group  Patients were invited to introduce themselves using an icebreaker question about their New Year's resolutions. Following this, patients received a goals worksheet designed to help them outline their objectives for the upcoming week, month, and year. Participants were encouraged to share their goals with the group, along with any obstacles they anticipate facing. The group setting provided a safe and supportive space, fostering an environment where patients felt comfortable expressing themselves and engaging in open discussion.    Participation Level:  Active  Participation Quality:  Appropriate, Attentive, and Sharing  Affect:  Appropriate  Cognitive:  Alert and Appropriate  Insight: Good and Improving  Engagement in Group:  Engaged and Improving  Modes of Intervention:  Discussion, Exploration, Socialization, and Support  Additional Comments:  Pt attended and actively participated in goals group  Kristi HERO [facility] 12/14/2024, 10:51 AM

## 2024-12-14 NOTE — Progress Notes (Signed)
" °   12/14/24 0800  Psych Admission Type (Psych Patients Only)  Admission Status Voluntary  Psychosocial Assessment  Patient Complaints None  Eye Contact Fair  Facial Expression Animated  Affect Appropriate to circumstance  Speech Soft  Interaction Assertive  Motor Activity Slow  Appearance/Hygiene Unremarkable  Behavior Characteristics Cooperative  Mood Pleasant  Thought Process  Coherency WDL  Content WDL  Delusions None reported or observed  Perception WDL  Hallucination None reported or observed  Judgment WDL  Confusion None  Danger to Self  Current suicidal ideation? Denies  Agreement Not to Harm Self Yes  Description of Agreement Verbal  Danger to Others  Danger to Others None reported or observed    "

## 2024-12-14 NOTE — Progress Notes (Signed)
" °  [facility] Adult Case Management Discharge Plan :  Will you be returning to the same living situation after discharge:  No.  Pt will move into new home with pastor's daughter who manages a home for church-goers. At discharge, do you have transportation home?: Yes,  Pt has transportation to/from the home. Do you have the ability to pay for your medications: Yes,  Pt has medical insurance.  Release of information consent forms completed and in the chart;  Patient's signature needed at discharge.  Patient to Follow up at:  Follow-up Information     [facility] Follow up on 12/19/2024.   Why: You have an appointment for therapy services on 12/19/24 at 12:30 pm with Heather, Virtual. Contact information: [address], [place], [place] [address]  Phone: [phone]        [facility] Follow up on 12/25/2024.   Why: You have an appointment for medication management services on 12/25/24 at 11:00 am, Virtual. Contact information: [address] [place] [address] [phone]                 Next level of care provider has access to [facility] Link:no  Safety Planning and Suicide Prevention discussed: Yes,  Completed with Heather (owner of home).     Has patient been referred to the Quitline?: Patient does not use tobacco/nicotine products  Patient has been referred for addiction treatment: No known substance use disorder.  Derick JONELLE Blanch, LCSW 12/14/2024, 9:06 AM "

## 2024-12-14 NOTE — Discharge Summary (Signed)
 " Physician Discharge Summary Note  Patient:  Jenna Santos is an 19 y.o., female MRN:  [id number] DOB:  [birth year] Patient phone:  [phone] (home)  Patient address:   [address] [place] [address],  Total Time spent with patient: 20 minutes  Date of Admission:  12/11/2024 Date of Discharge: 12/14/2024  Reason for Admission:   Jenna Santos is a 19 yr old female who presented on 12/29 to [facility] after a Suicide Attempt (OD 400 mg of Trazodone ), she was admitted to [facility] on 12/31.  PPHx is significant for Bipolar Disorder, Anxiety, ADHD, and a remote history of Self Injurious Behavior (Cutting- middle school), and no history of Prior Suicide Attempts or Psychiatric Hospitalizations.   When asked what led to her hospitalization she reports that Monday she got into an argument with her mentor.  She reports that her mentor said things that were hurtful and this triggered her and she just became overwhelmed.  She reports that due to this she impulsively took her trazodone  and that she did not plan this nor has she ever planned it.  She reports after she did so she told her mental what happened who then called the ambulance.  She reports they have had arguments like this in the past but she has always been able to cope but this was just the final straw.  She reports that Pardy issue is being treated as a child and not a young adult.  Principal Problem: Bipolar 1 disorder, depressed (HCC) Discharge Diagnoses: Principal Problem:   Bipolar 1 disorder, depressed (HCC)   Past Psychiatric History:  Bipolar Disorder, Anxiety, ADHD, and a remote history of Self Injurious Behavior (Cutting- middle school), and no history of Prior Suicide Attempts or Psychiatric Hospitalizations.   Past Medical History:  Past Medical History:  Diagnosis Date   Acanthosis nigricans 10/17/2018   Amenorrhea 03/31/2020   Attention deficit hyperactivity disorder (ADHD) 07/22/2022   Dysmenorrhea  03/15/2022   Elevated hemoglobin A1c 11/07/2018   Obesity    Tinnitus of both ears 09/12/2023    Past Surgical History:  Procedure Laterality Date   WISDOM TOOTH EXTRACTION Bilateral 2024   Family History:  Family History  Problem Relation Age of Onset   Arthritis Maternal Grandmother    Hyperlipidemia Maternal Grandmother    Hypertension Maternal Grandmother    Stroke Maternal Grandmother    Cancer Maternal Grandfather        stomach   Diabetes Maternal Grandfather    Alcohol abuse Neg Hx    Asthma Neg Hx    Birth defects Neg Hx    COPD Neg Hx    Depression Neg Hx    Drug abuse Neg Hx    Early death Neg Hx    Hearing loss Neg Hx    Heart disease Neg Hx    Kidney disease Neg Hx    Learning disabilities Neg Hx    Mental illness Neg Hx    Mental retardation Neg Hx    Miscarriages / Stillbirths Neg Hx    Vision loss Neg Hx    Varicose Veins Neg Hx    Family Psychiatric  History:  Maternal Cousin- EtOH Abuse Maternal Cousin- EtOH Abuse, Drug Abuse No formal diagnosis' but suspected No Known Suicides  Social History:  Social History   Substance and Sexual Activity  Alcohol Use No     Social History   Substance and Sexual Activity  Drug Use No    Social History  Socioeconomic History   Marital status: Single    Spouse name: Not on file   Number of children: Not on file   Years of education: Not on file   Highest education level: Not on file  Occupational History   Not on file  Tobacco Use   Smoking status: Never    Passive exposure: Never   Smokeless tobacco: Never  Vaping Use   Vaping status: Never Used  Substance and Sexual Activity   Alcohol use: No   Drug use: No   Sexual activity: Yes    Birth control/protection: Condom, I.U.D.  Other Topics Concern   Not on file  Social History Narrative   Lives at home with mom   Father not involved   12th grade at [employer]   Left handed          Social Drivers of Health   Tobacco  Use: Low Risk (12/11/2024)   Patient History    Smoking Tobacco Use: Never    Smokeless Tobacco Use: Never    Passive Exposure: Never  Financial Resource Strain: Not on file  Food Insecurity: No Food Insecurity (12/11/2024)   Epic    Worried About Programme Researcher, Broadcasting/film/video in the Last Year: Never true    Ran Out of Food in the Last Year: Never true  Transportation Needs: No Transportation Needs (12/11/2024)   Epic    Lack of Transportation (Medical): No    Lack of Transportation (Non-Medical): No  Physical Activity: Not on file  Stress: Not on file  Social Connections: Not on file  Depression (PHQ2-9): Medium Risk (12/10/2024)   Depression (PHQ2-9)    PHQ-2 Score: 9  Alcohol Screen: Low Risk (12/11/2024)   Alcohol Screen    Last Alcohol Screening Score (AUDIT): 0  Housing: High Risk (12/11/2024)   Epic    Unable to Pay for Housing in the Last Year: No    Number of Times Moved in the Last Year: 2    Homeless in the Last Year: No  Utilities: Not At Risk (12/11/2024)   Epic    Threatened with loss of utilities: No  Health Literacy: Not on file    Hospital Course:   During the patient's hospitalization, patient had extensive initial psychiatric evaluation, and follow-up psychiatric evaluations every day.  Psychiatric diagnoses provided upon initial assessment: Bipolar 1 disorder, depressed (HCC)   Patient's psychiatric medications were adjusted on admission: Her Vraylar and Lamictal were titrated.   During the hospitalization, other adjustments were made to the patient's psychiatric medication regimen: None  Patient's care was discussed during the interdisciplinary team meeting every day during the hospitalization.  The patient is not having side effects to prescribed psychiatric medication.  Gradually, patient started adjusting to milieu. The patient was evaluated each day by a clinical provider to ascertain response to treatment. Improvement was noted by the patient's report of  decreasing symptoms, improved sleep and appetite, affect, medication tolerance, behavior, and participation in unit programming.  Patient was asked each day to complete a self inventory noting mood, mental status, pain, new symptoms, anxiety and concerns.   Symptoms were reported as significantly decreased or resolved completely by discharge.  The patient reports that their mood is stable.  The patient denied having suicidal thoughts for more than 48 hours prior to discharge.  Patient denies having homicidal thoughts.  Patient denies having auditory hallucinations.  Patient denies any visual hallucinations or other symptoms of psychosis.  The patient was motivated to  continue taking medication with a goal of continued improvement in mental health.   The patient reports their target psychiatric symptoms of depression and impulsivity responded well to the psychiatric medications, and the patient reports overall benefit other psychiatric hospitalization. Supportive psychotherapy was provided to the patient. The patient also participated in regular group therapy while hospitalized. Coping skills, problem solving as well as relaxation therapies were also part of the unit programming.  Labs were reviewed with the patient, and abnormal results were discussed with the patient.  The patient is able to verbalize their individual safety plan to this provider.  # It is recommended to the patient to continue psychiatric medications as prescribed, after discharge from the hospital.    # It is recommended to the patient to follow up with your outpatient psychiatric provider and PCP.  # It was discussed with the patient, the impact of alcohol, drugs, tobacco have been there overall psychiatric and medical wellbeing, and total abstinence from substance use was recommended the patient.ed.  # Prescriptions provided or sent directly to preferred pharmacy at discharge. Patient agreeable to plan. Given opportunity to  ask questions. Appears to feel comfortable with discharge.    # In the event of worsening symptoms, the patient is instructed to call the crisis hotline, 911 and or go to the nearest ED for appropriate evaluation and treatment of symptoms. To follow-up with primary care provider for other medical issues, concerns and or health care needs  # Patient was discharged home with a plan to follow up as noted below.    On day of discharge she reports feeling good.  She reports that her mood has been more stable with the increases in her medications.  She reports no side effects to her medications.  She reports her appetite is good.  She reports her sleep is good.  She reports no SI, HI, or AVH.  Discussed with him the importance of taking his medications as prescribed and attending his follow up appointments and he reported understanding.  Discussed with him what to do in the event of a future crisis.  Discussed that he can go to [facility], go to the nearest ED, or call 911 or 988.   He reported understanding and had no concerns.  She was discharged home.   Physical Findings: AIMS: Facial and Oral Movements Muscles of Facial Expression: None Lips and Perioral Area: None Jaw: None Tongue: None,Extremity Movements Upper (arms, wrists, hands, fingers): None Lower (legs, knees, ankles, toes): None, Trunk Movements Neck, shoulders, hips: None, Global Judgements Severity of abnormal movements overall : None Incapacitation due to abnormal movements: None Patient's awareness of abnormal movements: No Awareness,  ,  , AIMS Total Score AIMS Total Score: 0 CIWA:    COWS:     Musculoskeletal: Strength & Muscle Tone: within normal limits Gait & Station: normal Patient leans: N/A   Psychiatric Specialty Exam:  Presentation  General Appearance:  Appropriate for Environment; Casual  Eye Contact: Good  Speech: Clear and Coherent; Normal Rate  Speech Volume: Normal  Handedness: Right   Mood and  Affect  Mood: Euthymic  Affect: Congruent; Appropriate   Thought Process  Thought Processes: Coherent; Goal Directed  Descriptions of Associations:Intact  Orientation:Full (Time, Place and Person)  Thought Content:Logical; WDL  History of Schizophrenia/Schizoaffective disorder:No  Duration of Psychotic Symptoms:No data recorded Hallucinations:Hallucinations: None  Ideas of Reference:None  Suicidal Thoughts:Suicidal Thoughts: No  Homicidal Thoughts:Homicidal Thoughts: No   Sensorium  Memory: Immediate Fair; Recent Fair  Judgment:  Good  Insight: Good   Executive Functions  Concentration: Good  Attention Span: Good  Recall: Good  Fund of Knowledge: Good  Language: Good   Psychomotor Activity  Psychomotor Activity: Psychomotor Activity: Normal   Assets  Assets: Communication Skills; Desire for Improvement; Resilience; Physical Health; Social Support; Housing   Sleep  Sleep: Sleep: Good  Estimated Sleeping Duration (Last 24 Hours): 5.25-7.00 hours   Physical Exam: Physical Exam Vitals and nursing note reviewed.  Constitutional:      General: She is not in acute distress.    Appearance: Normal appearance. She is normal weight. She is not ill-appearing or toxic-appearing.  HENT:     Head: Normocephalic and atraumatic.  Pulmonary:     Effort: Pulmonary effort is normal.  Musculoskeletal:        General: Normal range of motion.  Neurological:     General: No focal deficit present.     Mental Status: She is alert.    Review of Systems  Respiratory:  Negative for cough and shortness of breath.   Cardiovascular:  Negative for chest pain.  Gastrointestinal:  Negative for abdominal pain, constipation, diarrhea, nausea and vomiting.  Neurological:  Negative for dizziness, weakness and headaches.  Psychiatric/Behavioral:  Negative for depression, hallucinations and suicidal ideas. The patient is not nervous/anxious.    Blood pressure  122/73, pulse 77, temperature 98.2 F (36.8 C), temperature source Oral, resp. rate 16, height 5' 2.5 (1.588 m), weight 97.3 kg, SpO2 100%. Body mass index is 38.63 kg/m.   Tobacco Use History[1] Tobacco Cessation:  N/A, patient does not currently use tobacco products   Blood Alcohol level:  Lab Results  Component Value Date   [facility] <15 12/10/2024    Metabolic Disorder Labs:  Lab Results  Component Value Date   HGBA1C 5.4 12/12/2024   MPG 108.28 12/12/2024   MPG 120 12/21/2023   Lab Results  Component Value Date   PROLACTIN 23.9 12/12/2024   PROLACTIN 7.7 04/27/2022   Lab Results  Component Value Date   CHOL 161 12/12/2024   TRIG 37 12/12/2024   HDL 59 12/12/2024   CHOLHDL 2.7 12/12/2024   VLDL 7 12/12/2024   LDLCALC 94 12/12/2024   LDLCALC 76 12/21/2023    See Psychiatric Specialty Exam and Suicide Risk Assessment completed by Attending Physician prior to discharge.  Discharge destination:  Home  Is patient on multiple antipsychotic therapies at discharge:  No   Has Patient had three or more failed trials of antipsychotic monotherapy by history:  No  Recommended Plan for Multiple Antipsychotic Therapies: NA  Discharge Instructions     Increase activity slowly   Complete by: As directed       Allergies as of 12/14/2024       Reactions   Banana Anaphylaxis        Medication List     TAKE these medications      Indication  albuterol  108 (90 Base) MCG/ACT inhaler Commonly known as: Ventolin  HFA Inhale 1-2 puffs into the lungs every 6 (six) hours as needed for wheezing or shortness of breath.  Indication: Asthma   cariprazine 3 MG capsule Commonly known as: VRAYLAR Take 1 capsule (3 mg total) by mouth daily. Start taking on: December 15, 2024 What changed:  medication strength how much to take  Indication: Mood Stability   EPINEPHrine  0.3 mg/0.3 mL Soaj injection Commonly known as: EpiPen  2-Pak Inject 0.3 mg into the muscle as needed for  anaphylaxis.  Indication: Life-Threatening Hypersensitivity Reaction  hydrOXYzine  25 MG tablet Commonly known as: ATARAX  Take 25 mg by mouth daily.  Indication: Feeling Anxious   lamoTRIgine 25 MG tablet Commonly known as: LAMICTAL Take 3 tablets (75 mg total) by mouth daily. Start taking on: December 15, 2024 What changed:  how much to take when to take this  Indication: Mood Stability   levonorgestrel  20 MCG/DAY Iud Commonly known as: MIRENA  1 each by Intrauterine route once.  Indication: Birth Control Treatment   methylphenidate  36 MG CR tablet Commonly known as: CONCERTA  Take 36 mg by mouth daily.  Indication: ADHD - Attention Deficit Hyperactivity Disorder   traZODone  50 MG tablet Commonly known as: DESYREL  Take 50-100 mg by mouth at bedtime as needed.  Indication: Trouble Sleeping        Follow-up Information     [facility] Follow up on 12/19/2024.   Why: You have an appointment for therapy services on 12/19/24 at 12:30 pm with Heather, Virtual. Contact information: [address], [place], [place] [address]  Phone: [phone]        [facility] Follow up on 12/25/2024.   Why: You have an appointment for medication management services on 12/25/24 at 11:00 am, Virtual. Contact information: [address] [place] [address] [phone]                 Follow-up recommendations/Comments:   Activity: as tolerated   Diet: heart healthy   Other: -Follow-up with your outpatient psychiatric provider -instructions on appointment date, time, and address (location) are provided to you in discharge paperwork.   -Take your psychiatric medications as prescribed at discharge - instructions are provided to you in the discharge paperwork   -Follow-up with outpatient primary care doctor and other specialists -for management of chronic medical disease, including: None   -Testing: Follow-up with outpatient provider for  abnormal lab results: None   -Recommend abstinence from alcohol, tobacco, and other illicit drug use at discharge.    -If your psychiatric symptoms recur, worsen, or if you have side effects to your psychiatric medications, call your outpatient psychiatric provider, 911, 988 or go to the nearest emergency department.   -If suicidal thoughts recur, call your outpatient psychiatric provider, 911, 988 or go to the nearest emergency department.   Signed: Marsa GORMAN Rosser, DO 12/14/2024, 9:03 AM           [1]  Social History Tobacco Use  Smoking Status Never   Passive exposure: Never  Smokeless Tobacco Never   "

## 2024-12-14 NOTE — Plan of Care (Signed)

## 2024-12-14 NOTE — Progress Notes (Signed)
 Pt discharged to lobby. Pt was stable and appreciative at that time. All papers and prescriptions were given and valuables returned. Verbal understanding expressed. Denies SI/HI and A/VH. Pt given opportunity to express concerns and ask questions.

## 2024-12-14 NOTE — [facility] Suicide Risk Assessment (Signed)
 [facility] Discharge Suicide Risk Assessment   Principal Problem: Bipolar 1 disorder, depressed (HCC) Discharge Diagnoses: Principal Problem:   Bipolar 1 disorder, depressed (HCC)  During the patient's hospitalization, patient had extensive initial psychiatric evaluation, and follow-up psychiatric evaluations every day.  Psychiatric diagnoses provided upon initial assessment: Principal Problem:   Bipolar 1 disorder, depressed (HCC)  Patient's psychiatric medications were adjusted on admission: Her Vraylar and Lamictal were titrated.  During the hospitalization, other adjustments were made to the patient's psychiatric medication regimen: None  Gradually, patient started adjusting to milieu.   Patient's care was discussed during the interdisciplinary team meeting every day during the hospitalization.  The patient is not having side effects to prescribed psychiatric medication.  The patient reports their target psychiatric symptoms of depression and impulsivity responded well to the psychiatric medications, and the patient reports overall benefit other psychiatric hospitalization. Supportive psychotherapy was provided to the patient. The patient also participated in regular group therapy while admitted.   Labs were reviewed with the patient, and abnormal results were discussed with the patient.  The patient denied having suicidal thoughts more than 48 hours prior to discharge.  Patient denies having homicidal thoughts.  Patient denies having auditory hallucinations.  Patient denies any visual hallucinations.  Patient denies having paranoid thoughts.  The patient is able to verbalize their individual safety plan to this provider.  It is recommended to the patient to continue psychiatric medications as prescribed, after discharge from the hospital.    It is recommended to the patient to follow up with your outpatient psychiatric provider and PCP.  Discussed with the patient, the impact of  alcohol, drugs, tobacco have been there overall psychiatric and medical wellbeing, and total abstinence from substance use was recommended the patient.  Total Time spent with patient: 20 minutes  Musculoskeletal: Strength & Muscle Tone: within normal limits Gait & Station: normal Patient leans: N/A  Psychiatric Specialty Exam  Presentation  General Appearance:  Appropriate for Environment; Casual  Eye Contact: Good  Speech: Clear and Coherent; Normal Rate  Speech Volume: Normal  Handedness: Right   Mood and Affect  Mood: Euthymic  Duration of Depression Symptoms: N/A  Affect: Congruent; Appropriate   Thought Process  Thought Processes: Coherent; Goal Directed  Descriptions of Associations:Intact  Orientation:Full (Time, Place and Person)  Thought Content:Logical; WDL  History of Schizophrenia/Schizoaffective disorder:No  Duration of Psychotic Symptoms:No data recorded Hallucinations:Hallucinations: None  Ideas of Reference:None  Suicidal Thoughts:Suicidal Thoughts: No  Homicidal Thoughts:Homicidal Thoughts: No   Sensorium  Memory: Immediate Fair; Recent Fair  Judgment: Good  Insight: Good   Executive Functions  Concentration: Good  Attention Span: Good  Recall: Good  Fund of Knowledge: Good  Language: Good   Psychomotor Activity  Psychomotor Activity: Psychomotor Activity: Normal   Assets  Assets: Communication Skills; Desire for Improvement; Resilience; Physical Health; Social Support; Housing   Sleep  Sleep: Sleep: Good  Estimated Sleeping Duration (Last 24 Hours): 5.25-7.00 hours  Physical Exam: Physical Exam Vitals and nursing note reviewed.  Constitutional:      General: She is not in acute distress.    Appearance: Normal appearance. She is normal weight. She is not ill-appearing or toxic-appearing.  HENT:     Head: Normocephalic and atraumatic.  Pulmonary:     Effort: Pulmonary effort is normal.   Musculoskeletal:        General: Normal range of motion.  Neurological:     General: No focal deficit present.     Mental Status: She  is alert.    Review of Systems  Respiratory:  Negative for cough and shortness of breath.   Cardiovascular:  Negative for chest pain.  Gastrointestinal:  Negative for abdominal pain, constipation, diarrhea, nausea and vomiting.  Neurological:  Negative for dizziness, weakness and headaches.  Psychiatric/Behavioral:  Negative for depression, hallucinations and suicidal ideas. The patient is not nervous/anxious.    Blood pressure 122/73, pulse 77, temperature 98.2 F (36.8 C), temperature source Oral, resp. rate 16, height 5' 2.5 (1.588 m), weight 97.3 kg, SpO2 100%. Body mass index is 38.63 kg/m.  Mental Status Per Nursing Assessment::   On Admission:  NA  Demographic Factors:  Adolescent or young adult  Loss Factors: Loss of significant relationship  Historical Factors: Prior suicide attempts and Impulsivity  Risk Reduction Factors:   Living with another person, especially a relative, Positive social support, Positive therapeutic relationship, and Positive coping skills or problem solving skills  Continued Clinical Symptoms:  Bipolar Disorder  Cognitive Features That Contribute To Risk:  None    Suicide Risk:  Minimal: No identifiable suicidal ideation.  Patients presenting with no risk factors but with morbid ruminations; may be classified as minimal risk based on the severity of the depressive symptoms.  However, as she does have a prior Suicide Attempt, there is some chronic risk present.   Follow-up Information     [facility] Follow up on 12/19/2024.   Why: You have an appointment for therapy services on 12/19/24 at 12:30 pm with Heather, Virtual. Contact information: [address], [place], [place] [address]  Phone: [phone]        [facility] Follow up on 12/25/2024.   Why: You have an  appointment for medication management services on 12/25/24 at 11:00 am, Virtual. Contact information: [address] [place] [address] [phone]                 Plan Of Care/Follow-up recommendations:  Activity: as tolerated  Diet: heart healthy  Other: -Follow-up with your outpatient psychiatric provider -instructions on appointment date, time, and address (location) are provided to you in discharge paperwork.  -Take your psychiatric medications as prescribed at discharge - instructions are provided to you in the discharge paperwork  -Follow-up with outpatient primary care doctor and other specialists -for management of chronic medical disease, including: None  -Testing: Follow-up with outpatient provider for abnormal lab results: None  -Recommend abstinence from alcohol, tobacco, and other illicit drug use at discharge.   -If your psychiatric symptoms recur, worsen, or if you have side effects to your psychiatric medications, call your outpatient psychiatric provider, 911, [phone] or go to the nearest emergency department.  -If suicidal thoughts recur, call your outpatient psychiatric provider, 911, [phone] or go to the nearest emergency department.   Marsa GORMAN Rosser, DO 12/14/2024, 8:46 AM

## 2024-12-20 ENCOUNTER — Ambulatory Visit: Admitting: Physical Therapy

## 2024-12-24 NOTE — Telephone Encounter (Signed)
**Note De-Identified Dhara Schepp Obfuscation** I called the pt to discuss when she can come by the office to pick up a WatchPAP One-HST device and she stated that due to her job hours it is very difficult for her to come by the office. She starts work at 8 and gets off at 6 pm.  She asked if we are open on Gladis Vonn Novak day and I advised her that we are so she stated that she plans to come pick up a device on Monday 12/31/2024 if she does not forget.  I strongly encouraged her to come pick up the device and to do her study as Dr Shlomo is evaluating her for sleep apnea and that if she does have it and it is left untreated it can lead to serious health issues.   She verbalized understanding and thanked me for my call.

## 2025-01-08 ENCOUNTER — Emergency Department (HOSPITAL_COMMUNITY)

## 2025-01-08 ENCOUNTER — Emergency Department (HOSPITAL_COMMUNITY)
Admission: EM | Admit: 2025-01-08 | Discharge: 2025-01-09 | Disposition: A | Attending: Emergency Medicine | Admitting: Emergency Medicine

## 2025-01-08 ENCOUNTER — Other Ambulatory Visit: Payer: Self-pay

## 2025-01-08 ENCOUNTER — Encounter (HOSPITAL_COMMUNITY): Payer: Self-pay

## 2025-01-08 DIAGNOSIS — D72829 Elevated white blood cell count, unspecified: Secondary | ICD-10-CM | POA: Diagnosis not present

## 2025-01-08 DIAGNOSIS — R197 Diarrhea, unspecified: Secondary | ICD-10-CM | POA: Insufficient documentation

## 2025-01-08 DIAGNOSIS — R1084 Generalized abdominal pain: Secondary | ICD-10-CM | POA: Diagnosis not present

## 2025-01-08 DIAGNOSIS — R1030 Lower abdominal pain, unspecified: Secondary | ICD-10-CM | POA: Diagnosis present

## 2025-01-08 DIAGNOSIS — R112 Nausea with vomiting, unspecified: Secondary | ICD-10-CM | POA: Diagnosis not present

## 2025-01-08 DIAGNOSIS — M545 Low back pain, unspecified: Secondary | ICD-10-CM | POA: Insufficient documentation

## 2025-01-08 LAB — CBC
HCT: 39.5 % (ref 36.0–46.0)
HCT: 44.4 % (ref 36.0–46.0)
Hemoglobin: 12.4 g/dL (ref 12.0–15.0)
Hemoglobin: 14 g/dL (ref 12.0–15.0)
MCH: 23.4 pg — ABNORMAL LOW (ref 26.0–34.0)
MCH: 23.5 pg — ABNORMAL LOW (ref 26.0–34.0)
MCHC: 31.4 g/dL (ref 30.0–36.0)
MCHC: 31.5 g/dL (ref 30.0–36.0)
MCV: 74.5 fL — ABNORMAL LOW (ref 80.0–100.0)
MCV: 74.5 fL — ABNORMAL LOW (ref 80.0–100.0)
Platelets: 242 10*3/uL (ref 150–400)
Platelets: 274 10*3/uL (ref 150–400)
RBC: 5.3 MIL/uL — ABNORMAL HIGH (ref 3.87–5.11)
RBC: 5.96 MIL/uL — ABNORMAL HIGH (ref 3.87–5.11)
RDW: 14.8 % (ref 11.5–15.5)
RDW: 14.9 % (ref 11.5–15.5)
WBC: 11.2 10*3/uL — ABNORMAL HIGH (ref 4.0–10.5)
WBC: 7.6 10*3/uL (ref 4.0–10.5)
nRBC: 0 % (ref 0.0–0.2)
nRBC: 0 % (ref 0.0–0.2)

## 2025-01-08 LAB — COMPREHENSIVE METABOLIC PANEL WITH GFR
ALT: 9 U/L (ref 0–44)
AST: 20 U/L (ref 15–41)
Albumin: 4.2 g/dL (ref 3.5–5.0)
Alkaline Phosphatase: 86 U/L (ref 38–126)
Anion gap: 13 (ref 5–15)
BUN: 14 mg/dL (ref 6–20)
CO2: 20 mmol/L — ABNORMAL LOW (ref 22–32)
Calcium: 9.6 mg/dL (ref 8.9–10.3)
Chloride: 104 mmol/L (ref 98–111)
Creatinine, Ser: 0.85 mg/dL (ref 0.44–1.00)
GFR, Estimated: >60 mL/min
Glucose, Bld: 97 mg/dL (ref 70–99)
Potassium: 4.4 mmol/L (ref 3.5–5.1)
Sodium: 137 mmol/L (ref 135–145)
Total Bilirubin: 0.7 mg/dL (ref 0.0–1.2)
Total Protein: 7.5 g/dL (ref 6.5–8.1)

## 2025-01-08 LAB — URINALYSIS, ROUTINE W REFLEX MICROSCOPIC
Bilirubin Urine: NEGATIVE
Glucose, UA: NEGATIVE mg/dL
Hgb urine dipstick: NEGATIVE
Ketones, ur: 20 mg/dL — AB
Nitrite: NEGATIVE
Protein, ur: NEGATIVE mg/dL
Specific Gravity, Urine: 1.023 (ref 1.005–1.030)
pH: 5 (ref 5.0–8.0)

## 2025-01-08 LAB — HCG, SERUM, QUALITATIVE: Preg, Serum: NEGATIVE

## 2025-01-08 LAB — LIPASE, BLOOD: Lipase: 22 U/L (ref 11–51)

## 2025-01-08 MED ORDER — IOHEXOL 300 MG/ML  SOLN
100.0000 mL | Freq: Once | INTRAMUSCULAR | Status: AC | PRN
  Administered 2025-01-08: 100 mL via INTRAVENOUS

## 2025-01-08 MED ORDER — PANTOPRAZOLE SODIUM 40 MG IV SOLR
40.0000 mg | Freq: Once | INTRAVENOUS | Status: AC
  Administered 2025-01-08: 40 mg via INTRAVENOUS
  Filled 2025-01-08: qty 10

## 2025-01-08 MED ORDER — DICYCLOMINE HCL 10 MG PO CAPS
20.0000 mg | ORAL_CAPSULE | Freq: Once | ORAL | Status: AC
  Administered 2025-01-08: 20 mg via ORAL
  Filled 2025-01-08: qty 2

## 2025-01-08 MED ORDER — LACTATED RINGERS IV BOLUS
1000.0000 mL | Freq: Once | INTRAVENOUS | Status: AC
  Administered 2025-01-08: 1000 mL via INTRAVENOUS

## 2025-01-08 MED ORDER — ONDANSETRON HCL 4 MG/2ML IJ SOLN
4.0000 mg | Freq: Once | INTRAMUSCULAR | Status: AC
  Administered 2025-01-08: 4 mg via INTRAVENOUS
  Filled 2025-01-08: qty 2

## 2025-01-08 MED ORDER — MORPHINE SULFATE (PF) 4 MG/ML IV SOLN
4.0000 mg | Freq: Once | INTRAVENOUS | Status: AC
  Administered 2025-01-08: 4 mg via INTRAVENOUS
  Filled 2025-01-08: qty 1

## 2025-01-08 NOTE — ED Triage Notes (Signed)
 Pt reports with sharp lower abdominal pain, lower back pain, diarrhea, and vomiting since today.

## 2025-01-08 NOTE — ED Provider Notes (Signed)
 " [facility] Provider Note   CSN: [id number] Arrival date & time: 01/08/25  1920     History Chief Complaint  Patient presents with   Abdominal Pain    HPI: Azaylia Fong is a 19 y.o. female with history pertinent for depression, bipolar disorder, prediabetes, elevated BMI, PCOS who presents complaining of multiple symptoms. Patient arrived via POV from home.  History provided by patient.  No interpreter required during this encounter.  Patient reports that she began developing abdominal pain today.  Reports that it is primarily lower abdominal pain, radiating from her back to her front.  She has not had any bowel or bladder incontinence or retention with this.  Denies any dysuria, hematuria, urgency, frequency.  Denies fever, chills, chest pain, shortness of breath.  States that she did have 2 episodes of red emesis today, denies any upper abdominal pain, denies previous known history of liver dysfunction, prior history of hematemesis.  Reports that she does have history of PCOS, is unsure whether or not pain currently is similar to previous.  Patient's recorded medical, surgical, social, medication list and allergies were reviewed in the Snapshot window as part of the initial history.   Prior to Admission medications  Medication Sig Start Date End Date Taking? Authorizing Provider  albuterol  (VENTOLIN  HFA) 108 (90 Base) MCG/ACT inhaler Inhale 1-2 puffs into the lungs every 6 (six) hours as needed for wheezing or shortness of breath. 03/29/24   Gladis Elsie BROCKS, PA-C  cariprazine  (VRAYLAR ) 3 MG capsule Take 1 capsule (3 mg total) by mouth daily. 12/15/24   Raliegh Marsa RAMAN, DO  EPINEPHrine  (EPIPEN  2-PAK) 0.3 mg/0.3 mL IJ SOAJ injection Inject 0.3 mg into the muscle as needed for anaphylaxis. 09/12/23   Klett, Macario HERO, NP  hydrOXYzine  (ATARAX ) 25 MG tablet Take 25 mg by mouth daily. 10/23/24   [provider]  lamoTRIgine   (LAMICTAL ) 25 MG tablet Take 3 tablets (75 mg total) by mouth daily. 12/15/24   Raliegh Marsa RAMAN, DO  levonorgestrel  (MIRENA ) 20 MCG/DAY IUD 1 each by Intrauterine route once.    [provider]  methylphenidate  36 MG PO CR tablet Take 36 mg by mouth daily. 10/23/24   [provider]  traZODone  (DESYREL ) 50 MG tablet Take 50-100 mg by mouth at bedtime as needed. 04/12/24   [provider]     Allergies: Banana   Review of Systems   ROS as per HPI  Physical Exam Updated Vital Signs BP 111/67   Pulse 100   Temp 98.9 F (37.2 C) (Oral)   Resp 18   SpO2 97%  Physical Exam Vitals and nursing note reviewed.  Constitutional:      General: She is not in acute distress.    Appearance: She is well-developed.  HENT:     Head: Normocephalic and atraumatic.  Eyes:     Conjunctiva/sclera: Conjunctivae normal.  Cardiovascular:     Rate and Rhythm: Normal rate and regular rhythm.     Heart sounds: No murmur heard. Pulmonary:     Effort: Pulmonary effort is normal. No respiratory distress.     Breath sounds: Normal breath sounds.  Abdominal:     Palpations: Abdomen is soft.     Tenderness: There is abdominal tenderness (Diffuse abdominal tenderness, more prominent in the bilateral lower quadrants than the upper quadrants).  Musculoskeletal:        General: No swelling.     Cervical back: Neck supple.  Skin:    General: Skin is warm and dry.     Capillary Refill: Capillary refill takes less than 2 seconds.  Neurological:     Mental Status: She is alert.  Psychiatric:        Mood and Affect: Mood normal.     ED Course/ Medical Decision Making/ A&P    Procedures Procedures   Medications Ordered in ED Medications  ondansetron  (ZOFRAN ) injection 4 mg (4 mg Intravenous Given 01/08/25 2207)  morphine  (PF) 4 MG/ML injection 4 mg (4 mg Intravenous Given 01/08/25 2207)  lactated ringers  bolus 1,000 mL (1,000 mLs Intravenous New Bag/Given 01/08/25 2206)   iohexol  (OMNIPAQUE ) 300 MG/ML solution 100 mL (100 mLs Intravenous Contrast Given 01/08/25 2300)  pantoprazole  (PROTONIX ) injection 40 mg (40 mg Intravenous Given 01/08/25 2342)  dicyclomine  (BENTYL ) capsule 20 mg (20 mg Oral Given 01/08/25 2343)    Medical Decision Making:   Nadalie Laughner is a 19 y.o. female who presents for abdominal pain as per above.  Physical exam is pertinent for diffuse abdominal tenderness, more prominent in the lower than upper abdomen.   The differential includes but is not limited to blood loss anemia, hematemesis, esophageal varices, Mallory-Weiss tear, gastritis, gastroenteritis, pancreatitis, ovarian torsion, mittelschmerz,.  Independent historian: None  External data reviewed: Labs: reviewed prior labs for baseline  Initial Plan:  Screening labs including CBC and Metabolic panel to evaluate for infectious or metabolic etiology of disease.  Screening lipase for pancreatitis Screening hCG given patient's age Urinalysis with reflex culture ordered to evaluate for UTI or relevant urologic/nephrologic pathology.  CT abdomen and pelvis to evaluate for structural/infectious intra-abdominal pathology.  Transvaginal ultrasound to evaluate for ovarian pathology or torsion Objective evaluation as below reviewed   Labs: Ordered, Independent interpretation, and Details: CBC with mild nonspecific leukocytosis to 11.  No anemia, thrombocytopenia.  Repeat CBC without metric downtrend in Maslow blood cell count, hemoglobin, platelets, likely hemodilution given patient has received fluids.  CMP without AKI, emergent electro gentian, emergent LFT abnormality.  Lipase WNL.  Serum hCG negative.  Radiology: Ordered, Independent interpretation, Details: Urgently reviewed CT of the abdomen and pelvis, I do not appreciate free air, significant free fluid, focal fat stranding or hyperenhancement.  Transvaginal ultrasound not yet performed by the time of handoff, and All images  reviewed independently.  Agree with radiology report at this time.   CT ABDOMEN PELVIS W CONTRAST Result Date: 01/08/2025 EXAM: CT ABDOMEN AND PELVIS WITH CONTRAST 01/08/2025 11:10:32 PM TECHNIQUE: CT of the abdomen and pelvis was performed with the administration of 100 mL of iohexol  (OMNIPAQUE ) 300 MG/ML solution. Multiplanar reformatted images are provided for review. Automated exposure control, iterative reconstruction, and/or weight-based adjustment of the mA/kV was utilized to reduce the radiation dose to as low as reasonably achievable. COMPARISON: None available. CLINICAL HISTORY: Abdominal pain, acute, nonlocalized. FINDINGS: LOWER CHEST: No acute abnormality. LIVER: The liver is unremarkable. GALLBLADDER AND BILE DUCTS: Gallbladder is unremarkable. No biliary ductal dilatation. SPLEEN: No acute abnormality. PANCREAS: No acute abnormality. ADRENAL GLANDS: No acute abnormality. KIDNEYS, URETERS AND BLADDER: No stones in the kidneys or ureters. No hydronephrosis. No perinephric or periureteral stranding. Urinary bladder is unremarkable. GI AND BOWEL: Stomach demonstrates no acute abnormality. There is no bowel obstruction. PERITONEUM AND RETROPERITONEUM: Nonspecific free fluid within the pelvis. No free air. VASCULATURE: Aorta is normal in caliber. LYMPH NODES: No lymphadenopathy. REPRODUCTIVE ORGANS: T-shaped internal device within the uterus in appropriate position. The uterus is unremarkable. No adnexal mass. Right ovary corpus  luteum cyst - no further follow-up indicated. BONES AND SOFT TISSUES: No acute osseous abnormality. No focal soft tissue abnormality. IMPRESSION: 1. No acute findings in the abdomen or pelvis. 2. T-shaped intrauterine device in appropriate position. Electronically signed by: Morgane Naveau MD 01/08/2025 11:21 PM EST RP Workstation: [id number]    EKG/Medicine tests: Not indicated EKG Interpretation:    Interventions: Zofran , morphine , Protonix , Bentyl   See the EMR for full  details regarding lab and imaging results.  Patient presents for multiple symptoms, contemporaneous nausea, vomiting, diarrhea, as well as lower abdomina pain with diffuse abdominal tenderness to palpation more prominent in the lower abdomen.  Doubt pancreatitis given lipase WNL, doubt severe infectious, inflammatory etiology given no leukocytosis.  No significant metabolic derangement on CMP.  No anemia, therefore doubt significant blood loss, there is no active extravasation on patient's CT that is concerning for ongoing bleeding.  Repeat CBC obtained, hemoglobin of 4.4 from initial 14, however patient also has symmetric downtrend in platelets, Ruminski blood cells, and had received 1 L fluid, therefore likely component of hemodilution, patient has not had recurrent emesis while in the ED, therefore low index of suspicion for ongoing bleeding, particularly given patient does not have history of liver dysfunction, no elevated LFTs, I have low concern for emergent upper GI bleed such as esophageal variceal hemorrhage.  Patient without urinary symptoms, has contaminated urine sample with numerous squamous cells, negative for nitrites, therefore low index of suspicion for UTI.  Patient does have partial improvement in symptoms after multimodal pain therapy, however still has lower abdominal pain and tenderness, given history of PCOS, will obtain screening transvaginal ultrasound to rule out ovarian torsion.  Presentation is most consistent with acute complicated illness and Current presentation is complicated by underlying chronic conditions  Discussion of management or test interpretations with external provider(s): None by the time of handoff  Risk Drugs:OTC drugs, Prescription drug management, and Parenteral controlled substances Treatment: Pending at the time of handoff  Disposition: HANDOFF: At the time of signout, the patients transvaginal ultrasound had not yet been completed. I transferred care of  the patient at the time of signout to Dr. Trine. I informed the incoming care provider of the patient's history, status, and management plan. I addressed all of their concerns and/or questions to the best of my ability. Please refer to the incoming care provider's note for details regarding the remainder of the patient's ED course and disposition.  MDM generated using voice dictation software and may contain dictation errors.  Please contact me for any clarification or with any questions.  Clinical Impression:  1. Nausea vomiting and diarrhea   2. Lower abdominal pain      Data Unavailable   Final Clinical Impression(s) / ED Diagnoses Final diagnoses:  Nausea vomiting and diarrhea  Lower abdominal pain    Rx / DC Orders ED Discharge Orders     None        Rogelia Jerilynn RAMAN, MD 01/09/25 0000  "

## 2025-01-08 NOTE — ED Notes (Signed)
 Pt given urine cup for sample.

## 2025-01-09 ENCOUNTER — Emergency Department (HOSPITAL_COMMUNITY)

## 2025-01-09 MED ORDER — HYOSCYAMINE SULFATE SL 0.125 MG SL SUBL: 1.0000 | SUBLINGUAL_TABLET | Freq: Four times a day (QID) | SUBLINGUAL | 0 refills | Status: AC | PRN

## 2025-01-09 MED ORDER — SODIUM CHLORIDE 0.9 % IV BOLUS
500.0000 mL | Freq: Once | INTRAVENOUS | Status: AC
  Administered 2025-01-09: 500 mL via INTRAVENOUS

## 2025-01-09 MED ORDER — ONDANSETRON HCL 4 MG/2ML IJ SOLN
4.0000 mg | Freq: Once | INTRAMUSCULAR | Status: AC
  Administered 2025-01-09: 4 mg via INTRAVENOUS
  Filled 2025-01-09: qty 2

## 2025-01-09 MED ORDER — ONDANSETRON 4 MG PO TBDP: 4.0000 mg | ORAL_TABLET | Freq: Three times a day (TID) | ORAL | 0 refills | Status: AC | PRN

## 2025-01-09 NOTE — ED Provider Notes (Signed)
 I assumed care of this patient from previous provider.  Please see their note for further details of history, exam, and MDM.   Briefly patient is a 19 y.o. female who presented with lower abd pain. W/u thus far reassuring. Pending pelvic US .  US  negative for torsion.  The patient appears reasonably screened and/or stabilized for discharge and I doubt any other medical condition or other [facility] requiring further screening, evaluation, or treatment in the ED at this time. I have discussed the findings, Dx and Tx plan with the patient/family who expressed understanding and agree(s) with the plan. Discharge instructions discussed at length. The patient/family was given strict return precautions who verbalized understanding of the instructions. No further questions at time of discharge.  Disposition: Discharge  Condition: Good  ED Discharge Orders          Ordered    ondansetron  (ZOFRAN -ODT) 4 MG disintegrating tablet  Every 8 hours PRN        01/09/25 0326    Hyoscyamine  Sulfate SL (LEVSIN /SL) 0.125 MG SUBL  4 times daily PRN        01/09/25 0326              Follow Up: Auston Opal, DO [address] [place] [address] [phone]  Call  to schedule an appointment for close follow up          Marrah Vanevery, Raynell Moder, MD 01/09/25 [phone]

## 2025-02-05 ENCOUNTER — Ambulatory Visit: Admitting: Physical Therapy

## 2025-02-28 ENCOUNTER — Ambulatory Visit: Admitting: Dermatology

## 2025-04-02 ENCOUNTER — Ambulatory Visit: Admitting: Neurology
